# Patient Record
Sex: Male | Born: 1955 | Smoking: Current every day smoker
Health system: Southern US, Community
[De-identification: ages and names within clinical notes are randomized; demographics above are authoritative.]

## PROBLEM LIST (undated history)

## (undated) ENCOUNTER — Telehealth

## (undated) ENCOUNTER — Encounter

## (undated) ENCOUNTER — Ambulatory Visit: Payer: MEDICARE

## (undated) ENCOUNTER — Ambulatory Visit: Payer: MEDICARE | Attending: Surgery | Primary: Surgery

## (undated) ENCOUNTER — Ambulatory Visit

## (undated) ENCOUNTER — Encounter: Attending: Hematology & Oncology | Primary: Hematology & Oncology

## (undated) ENCOUNTER — Ambulatory Visit: Payer: Medicare (Managed Care)

## (undated) ENCOUNTER — Telehealth: Attending: Physician Assistant | Primary: Physician Assistant

## (undated) ENCOUNTER — Inpatient Hospital Stay

## (undated) ENCOUNTER — Encounter: Attending: Radiation Oncology | Primary: Radiation Oncology

## (undated) ENCOUNTER — Encounter: Attending: Cardiovascular Disease | Primary: Cardiovascular Disease

## (undated) ENCOUNTER — Other Ambulatory Visit

## (undated) ENCOUNTER — Encounter: Attending: Family | Primary: Family

## (undated) ENCOUNTER — Ambulatory Visit: Payer: MEDICARE | Attending: Cardiovascular Disease | Primary: Cardiovascular Disease

## (undated) ENCOUNTER — Encounter: Attending: Surgery | Primary: Surgery

## (undated) ENCOUNTER — Ambulatory Visit: Payer: MEDICAID | Attending: Surgery | Primary: Surgery

## (undated) ENCOUNTER — Ambulatory Visit: Attending: Radiation Oncology | Primary: Radiation Oncology

## (undated) ENCOUNTER — Telehealth: Attending: Surgery | Primary: Surgery

## (undated) ENCOUNTER — Telehealth: Attending: Hematology & Oncology | Primary: Hematology & Oncology

## (undated) ENCOUNTER — Ambulatory Visit: Payer: Medicare (Managed Care) | Attending: Hematology & Oncology | Primary: Hematology & Oncology

## (undated) ENCOUNTER — Ambulatory Visit: Attending: Medical | Primary: Medical

## (undated) ENCOUNTER — Ambulatory Visit: Payer: MEDICARE | Attending: Colon & Rectal Surgery | Primary: Colon & Rectal Surgery

## (undated) ENCOUNTER — Telehealth: Attending: Clinical | Primary: Clinical

## (undated) ENCOUNTER — Institutional Professional Consult (permissible substitution): Attending: Radiation Oncology | Primary: Radiation Oncology

## (undated) ENCOUNTER — Ambulatory Visit: Payer: Medicare (Managed Care) | Attending: Surgery | Primary: Surgery

## (undated) ENCOUNTER — Encounter
Attending: Student in an Organized Health Care Education/Training Program | Primary: Student in an Organized Health Care Education/Training Program

## (undated) ENCOUNTER — Ambulatory Visit: Attending: Colon & Rectal Surgery | Primary: Colon & Rectal Surgery

## (undated) ENCOUNTER — Encounter: Attending: Adult Health | Primary: Adult Health

## (undated) ENCOUNTER — Ambulatory Visit: Payer: MEDICARE | Attending: Neurology | Primary: Neurology

## (undated) ENCOUNTER — Ambulatory Visit: Payer: Medicare (Managed Care) | Attending: Neurology | Primary: Neurology

## (undated) ENCOUNTER — Telehealth: Attending: Family | Primary: Family

## (undated) DIAGNOSIS — J449 Chronic obstructive pulmonary disease, unspecified: Secondary | ICD-10-CM

## (undated) DIAGNOSIS — I639 Cerebral infarction, unspecified: Secondary | ICD-10-CM

## (undated) DIAGNOSIS — I10 Essential (primary) hypertension: Secondary | ICD-10-CM

## (undated) DIAGNOSIS — I4891 Unspecified atrial fibrillation: Secondary | ICD-10-CM

## (undated) DIAGNOSIS — E7800 Pure hypercholesterolemia, unspecified: Secondary | ICD-10-CM

## (undated) HISTORY — PX: APPENDECTOMY: SHX54

## (undated) MED ORDER — OXYCODONE-ACETAMINOPHEN 5 MG-325 MG TABLET
ORAL | 0 days | PRN
Start: ? — End: 2021-02-11

## (undated) MED ORDER — FUROSEMIDE 20 MG TABLET: Freq: Every day | ORAL | 0 days | Status: SS | PRN

---
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
## 2017-04-03 ENCOUNTER — Ambulatory Visit
Admission: RE | Admit: 2017-04-03 | Discharge: 2017-04-03 | Disposition: A | Discharge status: Home / Self Care | Attending: Colon & Rectal Surgery | Admitting: Colon & Rectal Surgery

## 2017-04-03 DIAGNOSIS — K603 Anal fistula: Principal | ICD-10-CM

## 2017-06-17 DIAGNOSIS — K603 Anal fistula: Principal | ICD-10-CM

## 2017-06-18 ENCOUNTER — Ambulatory Visit
Admission: RE | Admit: 2017-06-18 | Discharge: 2017-06-18 | Disposition: A | Discharge status: Home / Self Care | Attending: Certified Registered" | Admitting: Certified Registered"

## 2017-06-18 ENCOUNTER — Ambulatory Visit
Admission: RE | Admit: 2017-06-18 | Discharge: 2017-06-18 | Disposition: A | Discharge status: Home / Self Care | Attending: Colon & Rectal Surgery | Admitting: Colon & Rectal Surgery

## 2017-06-18 DIAGNOSIS — K603 Anal fistula: Principal | ICD-10-CM

## 2017-06-18 MED ORDER — ACETAMINOPHEN 325 MG TABLET
ORAL_TABLET | Freq: Four times a day (QID) | ORAL | 0 refills | 0.00000 days | Status: CP
Start: 2017-06-18 — End: ?

## 2017-06-18 MED ORDER — OXYCODONE 5 MG TABLET
ORAL_TABLET | ORAL | 0 refills | 0.00000 days | Status: CP | PRN
Start: 2017-06-18 — End: 2017-06-18

## 2017-06-18 MED ORDER — ONDANSETRON 4 MG DISINTEGRATING TABLET
ORAL_TABLET | Freq: Three times a day (TID) | ORAL | 0 refills | 0.00000 days | Status: CP | PRN
Start: 2017-06-18 — End: 2017-07-18

## 2017-06-18 MED ORDER — OXYCODONE 5 MG TABLET: 5 mg | tablet | 0 refills | 0 days | Status: AC

## 2017-06-18 MED ORDER — DOCUSATE SODIUM 100 MG CAPSULE
ORAL_CAPSULE | Freq: Two times a day (BID) | ORAL | 0 refills | 0.00000 days | Status: CP
Start: 2017-06-18 — End: 2017-07-18

## 2017-07-04 ENCOUNTER — Ambulatory Visit
Admission: RE | Admit: 2017-07-04 | Discharge: 2017-07-04 | Disposition: A | Discharge status: Home / Self Care | Payer: MEDICAID | Attending: Internal Medicine

## 2017-07-04 DIAGNOSIS — I4891 Unspecified atrial fibrillation: Principal | ICD-10-CM

## 2017-07-04 DIAGNOSIS — I639 Cerebral infarction, unspecified: Secondary | ICD-10-CM

## 2017-07-04 DIAGNOSIS — I4819 Other persistent atrial fibrillation: Secondary | ICD-10-CM

## 2017-07-04 DIAGNOSIS — N182 Chronic kidney disease, stage 2 (mild): Secondary | ICD-10-CM

## 2017-07-04 MED ORDER — APIXABAN 5 MG TABLET
ORAL_TABLET | Freq: Two times a day (BID) | ORAL | 6 refills | 0.00000 days | Status: CP
Start: 2017-07-04 — End: 2017-08-03

## 2017-07-17 ENCOUNTER — Ambulatory Visit
Admission: RE | Admit: 2017-07-17 | Discharge: 2017-07-17 | Disposition: A | Discharge status: Home / Self Care | Payer: MEDICAID | Attending: Colon & Rectal Surgery | Admitting: Colon & Rectal Surgery

## 2017-07-17 DIAGNOSIS — K603 Anal fistula: Principal | ICD-10-CM

## 2017-08-15 ENCOUNTER — Ambulatory Visit
Admission: RE | Admit: 2017-08-15 | Discharge: 2017-08-15 | Disposition: A | Discharge status: Home / Self Care | Payer: MEDICAID | Attending: Colon & Rectal Surgery

## 2017-08-15 DIAGNOSIS — Z09 Encounter for follow-up examination after completed treatment for conditions other than malignant neoplasm: Secondary | ICD-10-CM

## 2017-08-15 DIAGNOSIS — K603 Anal fistula: Principal | ICD-10-CM

## 2017-08-25 ENCOUNTER — Ambulatory Visit: Admission: RE | Admit: 2017-08-25 | Discharge: 2017-08-25 | Disposition: A | Discharge status: Home / Self Care

## 2017-08-25 DIAGNOSIS — I4819 Other persistent atrial fibrillation: Principal | ICD-10-CM

## 2017-09-05 ENCOUNTER — Ambulatory Visit
Admission: RE | Admit: 2017-09-05 | Discharge: 2017-09-05 | Disposition: A | Discharge status: Home / Self Care | Payer: MEDICAID | Attending: Internal Medicine | Admitting: Internal Medicine

## 2017-09-05 DIAGNOSIS — I482 Chronic atrial fibrillation, unspecified: Principal | ICD-10-CM

## 2017-11-03 ENCOUNTER — Ambulatory Visit
Admit: 2017-11-03 | Discharge: 2017-11-04 | Payer: MEDICARE | Attending: Colon & Rectal Surgery | Primary: Colon & Rectal Surgery

## 2017-11-03 DIAGNOSIS — K603 Anal fistula: Principal | ICD-10-CM

## 2017-11-03 DIAGNOSIS — I482 Chronic atrial fibrillation, unspecified: Secondary | ICD-10-CM

## 2017-11-03 DIAGNOSIS — F17200 Nicotine dependence, unspecified, uncomplicated: Secondary | ICD-10-CM

## 2017-11-03 DIAGNOSIS — N182 Chronic kidney disease, stage 2 (mild): Secondary | ICD-10-CM

## 2017-11-27 DIAGNOSIS — K6131 Horseshoe abscess: Principal | ICD-10-CM

## 2017-11-28 ENCOUNTER — Encounter: Admit: 2017-11-28 | Discharge: 2017-11-28 | Payer: MEDICARE

## 2017-11-28 ENCOUNTER — Ambulatory Visit: Admit: 2017-11-28 | Discharge: 2017-11-28 | Payer: MEDICARE

## 2017-11-28 DIAGNOSIS — K6131 Horseshoe abscess: Principal | ICD-10-CM

## 2017-11-28 MED ORDER — OXYCODONE 5 MG TABLET
ORAL_TABLET | ORAL | 0 refills | 0.00000 days | Status: CP | PRN
Start: 2017-11-28 — End: 2017-12-03

## 2018-01-16 MED ORDER — TRAZODONE 100 MG TABLET
ORAL_TABLET | 3 refills | 0 days
Start: 2018-01-16 — End: 2019-04-07

## 2018-02-15 MED ORDER — LISINOPRIL 20 MG-HYDROCHLOROTHIAZIDE 25 MG TABLET
ORAL_TABLET | 3 refills | 0 days
Start: 2018-02-15 — End: 2019-04-23

## 2018-03-05 ENCOUNTER — Emergency Department (HOSPITAL_COMMUNITY): Payer: Medicaid Other

## 2018-03-05 ENCOUNTER — Encounter (HOSPITAL_COMMUNITY): Payer: Self-pay

## 2018-03-05 ENCOUNTER — Observation Stay (HOSPITAL_COMMUNITY)
Admission: EM | Admit: 2018-03-05 | Discharge: 2018-03-06 | Disposition: A | Discharge status: Home / Self Care | Payer: Medicaid Other | Attending: Internal Medicine | Admitting: Internal Medicine

## 2018-03-05 DIAGNOSIS — J449 Chronic obstructive pulmonary disease, unspecified: Secondary | ICD-10-CM | POA: Diagnosis present

## 2018-03-05 DIAGNOSIS — Z7982 Long term (current) use of aspirin: Secondary | ICD-10-CM | POA: Insufficient documentation

## 2018-03-05 DIAGNOSIS — G9341 Metabolic encephalopathy: Secondary | ICD-10-CM | POA: Diagnosis not present

## 2018-03-05 DIAGNOSIS — G459 Transient cerebral ischemic attack, unspecified: Secondary | ICD-10-CM

## 2018-03-05 DIAGNOSIS — Z7901 Long term (current) use of anticoagulants: Secondary | ICD-10-CM | POA: Diagnosis not present

## 2018-03-05 DIAGNOSIS — I4891 Unspecified atrial fibrillation: Secondary | ICD-10-CM | POA: Diagnosis present

## 2018-03-05 DIAGNOSIS — R200 Anesthesia of skin: Principal | ICD-10-CM | POA: Insufficient documentation

## 2018-03-05 DIAGNOSIS — E7800 Pure hypercholesterolemia, unspecified: Secondary | ICD-10-CM | POA: Diagnosis not present

## 2018-03-05 DIAGNOSIS — R55 Syncope and collapse: Secondary | ICD-10-CM | POA: Insufficient documentation

## 2018-03-05 DIAGNOSIS — N179 Acute kidney failure, unspecified: Secondary | ICD-10-CM | POA: Diagnosis not present

## 2018-03-05 DIAGNOSIS — E876 Hypokalemia: Secondary | ICD-10-CM | POA: Diagnosis not present

## 2018-03-05 DIAGNOSIS — I10 Essential (primary) hypertension: Secondary | ICD-10-CM | POA: Diagnosis not present

## 2018-03-05 DIAGNOSIS — R251 Tremor, unspecified: Secondary | ICD-10-CM | POA: Diagnosis present

## 2018-03-05 DIAGNOSIS — D72829 Elevated white blood cell count, unspecified: Secondary | ICD-10-CM | POA: Diagnosis not present

## 2018-03-05 DIAGNOSIS — I482 Chronic atrial fibrillation, unspecified: Secondary | ICD-10-CM

## 2018-03-05 DIAGNOSIS — I639 Cerebral infarction, unspecified: Secondary | ICD-10-CM

## 2018-03-05 DIAGNOSIS — F17200 Nicotine dependence, unspecified, uncomplicated: Secondary | ICD-10-CM | POA: Insufficient documentation

## 2018-03-05 HISTORY — DX: Cerebral infarction, unspecified: I63.9

## 2018-03-05 HISTORY — DX: Pure hypercholesterolemia, unspecified: E78.00

## 2018-03-05 HISTORY — DX: Chronic obstructive pulmonary disease, unspecified: J44.9

## 2018-03-05 HISTORY — DX: Unspecified atrial fibrillation: I48.91

## 2018-03-05 HISTORY — DX: Essential (primary) hypertension: I10

## 2018-03-05 LAB — I-STAT TROPONIN, ED: Troponin i, poc: 0 ng/mL (ref 0.00–0.08)

## 2018-03-05 LAB — COMPREHENSIVE METABOLIC PANEL
ALBUMIN: 3.6 g/dL (ref 3.5–5.0)
ALK PHOS: 84 U/L (ref 38–126)
ALT: 15 U/L — AB (ref 17–63)
AST: 23 U/L (ref 15–41)
Anion gap: 9 (ref 5–15)
BILIRUBIN TOTAL: 0.6 mg/dL (ref 0.3–1.2)
BUN: 26 mg/dL — ABNORMAL HIGH (ref 6–20)
CALCIUM: 9.4 mg/dL (ref 8.9–10.3)
CO2: 23 mmol/L (ref 22–32)
Chloride: 106 mmol/L (ref 101–111)
Creatinine, Ser: 1.66 mg/dL — ABNORMAL HIGH (ref 0.61–1.24)
GFR calc non Af Amer: 42 mL/min — ABNORMAL LOW (ref >60)
GFR, EST AFRICAN AMERICAN: 49 mL/min — AB (ref >60)
Glucose, Bld: 120 mg/dL — ABNORMAL HIGH (ref 65–99)
Potassium: 3 mmol/L — ABNORMAL LOW (ref 3.5–5.1)
SODIUM: 138 mmol/L (ref 135–145)
Total Protein: 7.2 g/dL (ref 6.5–8.1)

## 2018-03-05 LAB — PROTIME-INR
INR: 1.29
Prothrombin Time: 16 seconds — ABNORMAL HIGH (ref 11.4–15.2)

## 2018-03-05 LAB — URINALYSIS, ROUTINE W REFLEX MICROSCOPIC
Bilirubin Urine: NEGATIVE
GLUCOSE, UA: NEGATIVE mg/dL
HGB URINE DIPSTICK: NEGATIVE
KETONES UR: NEGATIVE mg/dL
Leukocytes, UA: NEGATIVE
Nitrite: NEGATIVE
PH: 5 (ref 5.0–8.0)
Protein, ur: NEGATIVE mg/dL
Specific Gravity, Urine: 1.009 (ref 1.005–1.030)

## 2018-03-05 LAB — CBC WITH DIFFERENTIAL/PLATELET
ABS IMMATURE GRANULOCYTES: 0.1 10*3/uL (ref 0.0–0.1)
BASOS ABS: 0.1 10*3/uL (ref 0.0–0.1)
Basophils Relative: 1 %
Eosinophils Absolute: 0.1 10*3/uL (ref 0.0–0.7)
Eosinophils Relative: 1 %
HEMATOCRIT: 43 % (ref 39.0–52.0)
HEMOGLOBIN: 14.1 g/dL (ref 13.0–17.0)
Immature Granulocytes: 0 %
LYMPHS ABS: 1.4 10*3/uL (ref 0.7–4.0)
LYMPHS PCT: 12 %
MCH: 30.5 pg (ref 26.0–34.0)
MCHC: 32.8 g/dL (ref 30.0–36.0)
MCV: 92.9 fL (ref 78.0–100.0)
MONO ABS: 0.6 10*3/uL (ref 0.1–1.0)
Monocytes Relative: 5 %
NEUTROS ABS: 9.6 10*3/uL — AB (ref 1.7–7.7)
Neutrophils Relative %: 81 %
Platelets: 262 10*3/uL (ref 150–400)
RBC: 4.63 MIL/uL (ref 4.22–5.81)
RDW: 14.2 % (ref 11.5–15.5)
WBC: 11.9 10*3/uL — ABNORMAL HIGH (ref 4.0–10.5)

## 2018-03-05 LAB — CBG MONITORING, ED: GLUCOSE-CAPILLARY: 103 mg/dL — AB (ref 65–99)

## 2018-03-05 LAB — AMMONIA: Ammonia: 34 umol/L (ref 9–35)

## 2018-03-05 MED ORDER — METOPROLOL SUCCINATE ER 100 MG PO TB24
100.0000 mg | ORAL_TABLET | Freq: Every day | ORAL | Status: DC
  Administered 2018-03-06: 100 mg via ORAL
  Filled 2018-03-05: qty 1

## 2018-03-05 MED ORDER — ACETAMINOPHEN 325 MG PO TABS: 650.0000 mg | ORAL_TABLET | ORAL | Status: DC | PRN

## 2018-03-05 MED ORDER — ACETAMINOPHEN 160 MG/5ML PO SOLN: 650.0000 mg | ORAL | Status: DC | PRN

## 2018-03-05 MED ORDER — ZOLPIDEM TARTRATE 5 MG PO TABS: 5.0000 mg | ORAL_TABLET | Freq: Every evening | ORAL | Status: DC | PRN

## 2018-03-05 MED ORDER — HYDRALAZINE HCL 20 MG/ML IJ SOLN: 5.0000 mg | INTRAMUSCULAR | Status: DC | PRN

## 2018-03-05 MED ORDER — ASPIRIN 81 MG PO CHEW
81.0000 mg | CHEWABLE_TABLET | Freq: Every day | ORAL | Status: DC
  Administered 2018-03-06: 81 mg via ORAL
  Filled 2018-03-05: qty 1

## 2018-03-05 MED ORDER — IOPAMIDOL (ISOVUE-370) INJECTION 76%
INTRAVENOUS | Status: AC
  Filled 2018-03-05: qty 50

## 2018-03-05 MED ORDER — IOPAMIDOL (ISOVUE-370) INJECTION 76%
50.0000 mL | Freq: Once | INTRAVENOUS | Status: AC | PRN
  Administered 2018-03-05: 50 mL via INTRAVENOUS

## 2018-03-05 MED ORDER — ATORVASTATIN CALCIUM 10 MG PO TABS
10.0000 mg | ORAL_TABLET | Freq: Every day | ORAL | Status: DC
  Administered 2018-03-06: 10 mg via ORAL
  Filled 2018-03-05: qty 1

## 2018-03-05 MED ORDER — ONDANSETRON HCL 4 MG/2ML IJ SOLN
4.0000 mg | Freq: Three times a day (TID) | INTRAMUSCULAR | Status: DC | PRN
Start: 2018-03-05 — End: 2018-03-06

## 2018-03-05 MED ORDER — STROKE: EARLY STAGES OF RECOVERY BOOK
Freq: Once | Status: AC
  Administered 2018-03-06: 01:00:00
  Filled 2018-03-05: qty 1

## 2018-03-05 MED ORDER — ACETAMINOPHEN 650 MG RE SUPP: 650.0000 mg | RECTAL | Status: DC | PRN

## 2018-03-05 MED ORDER — POTASSIUM CHLORIDE 20 MEQ/15ML (10%) PO SOLN
40.0000 meq | Freq: Once | ORAL | Status: AC
  Administered 2018-03-06: 40 meq via ORAL
  Filled 2018-03-05: qty 30

## 2018-03-05 MED ORDER — SODIUM CHLORIDE 0.9 % IV BOLUS
1000.0000 mL | Freq: Once | INTRAVENOUS | Status: AC
  Administered 2018-03-05: 1000 mL via INTRAVENOUS

## 2018-03-05 MED ORDER — APIXABAN 5 MG PO TABS
5.0000 mg | ORAL_TABLET | Freq: Two times a day (BID) | ORAL | Status: DC
  Administered 2018-03-06 (×2): 5 mg via ORAL
  Filled 2018-03-05 (×2): qty 1

## 2018-03-05 MED ORDER — SENNOSIDES-DOCUSATE SODIUM 8.6-50 MG PO TABS: 1.0000 | ORAL_TABLET | Freq: Every evening | ORAL | Status: DC | PRN

## 2018-03-05 MED ORDER — SODIUM CHLORIDE 0.9 % IV SOLN
INTRAVENOUS | Status: DC
  Administered 2018-03-06: 01:00:00 via INTRAVENOUS

## 2018-03-05 NOTE — ED Provider Notes (Signed)
[facility] EMERGENCY DEPARTMENT Provider Note   CSN: [id number] Arrival date & time: 03/05/18  1957     History   Chief Complaint No chief complaint on file.   HPI Keith Morton is a 62 y.o. male.  Level 5 caveat secondary to altered mental status.  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  After that patient is unsure of what happened.  Per EMS he had some tremor to his right arm and then became altered and combative on scene with some incontinence of urine and had bit his tongue.  Patient does not recall any of this.  He denies any prior history of seizures and does not drink very frequently.  He still is confused and a little evasive on answers.  Currently says is tingling in his arm is resolved and he denies any medical complaints now.  The history is provided by the patient and the EMS personnel.  Altered Mental Status   This is a new problem. The current episode started less than 1 hour ago. The problem has been gradually improving. Associated symptoms include confusion, seizures (possible) and agitation. His past medical history is significant for hypertension.    Past Medical History:  Diagnosis Date  . COPD (chronic obstructive pulmonary disease) (HCC)   . Hypertension     There are no active problems to display for this patient.   Past Surgical History:  Procedure Laterality Date  . APPENDECTOMY          Home Medications    Prior to Admission medications   Not on File    Family History History reviewed. No pertinent family history.  Social History Social History   Tobacco Use  . Smoking status: Current Every Day Smoker  . Smokeless tobacco: Never Used  Substance Use Topics  . Alcohol use: Not on file  . Drug use: Not on file     Allergies   Patient has no known allergies.   Review of Systems Review of Systems  Unable to perform ROS: Mental status  change  Constitutional: Negative for chills and fever.  HENT: Negative for sore throat.   Respiratory: Negative for shortness of breath.   Cardiovascular: Negative for chest pain.  Gastrointestinal: Negative for abdominal pain.  Genitourinary: Negative for dysuria.  Skin: Negative for rash.  Neurological: Positive for seizures (possible) and numbness.  Psychiatric/Behavioral: Positive for agitation and confusion.     Physical Exam Updated Vital Signs BP 127/86 (BP Location: Right Arm)   Pulse (!) 101   Temp 99 F (37.2 C) (Oral)   Resp (!) 26   Ht 5\' 9"  (1.753 m)   Wt 86.2 kg (190 lb)   SpO2 100%   BMI 28.06 kg/m   Physical Exam  Constitutional: He is oriented to person, place, and time. He appears well-developed and well-nourished.  HENT:  Head: Normocephalic and atraumatic.  Eyes: Conjunctivae are normal.  Neck: Neck supple.  Cardiovascular: Normal pulses. An irregularly irregular rhythm present. Tachycardia present.  No murmur heard. Pulmonary/Chest: Effort normal and breath sounds normal. No respiratory distress.  Abdominal: Soft. There is no tenderness.  Musculoskeletal: He exhibits no edema.  Neurological: He is alert and oriented to person, place, and time. He has normal strength. No cranial nerve deficit or sensory deficit. GCS eye subscore is 4. GCS verbal subscore is 5. GCS motor subscore is 6.  His basic neuro  exam was normal other than he is a little confused with his answers.  When I reassessed him some questions a few minutes later he was more able to give his past medical history.  Skin: Skin is warm and dry.  Psychiatric: He has a normal mood and affect.  Nursing note and vitals reviewed.    ED Treatments / Results  Labs (all labs ordered are listed, but only abnormal results are displayed) Labs Reviewed  CBC WITH DIFFERENTIAL/PLATELET - Abnormal; Notable for the following components:      Result Value   WBC 11.9 (*)    Neutro Abs 9.6 (*)    All  other components within normal limits  COMPREHENSIVE METABOLIC PANEL - Abnormal; Notable for the following components:   Potassium 3.0 (*)    Glucose, Bld 120 (*)    BUN 26 (*)    Creatinine, Ser 1.66 (*)    ALT 15 (*)    GFR calc non Af Amer 42 (*)    GFR calc Af Amer 49 (*)    All other components within normal limits  URINALYSIS, ROUTINE W REFLEX MICROSCOPIC - Abnormal; Notable for the following components:   Color, Urine STRAW (*)    All other components within normal limits  PROTIME-INR - Abnormal; Notable for the following components:   Prothrombin Time 16.0 (*)    All other components within normal limits  MAGNESIUM - Abnormal; Notable for the following components:   Magnesium 1.6 (*)    All other components within normal limits  HEMOGLOBIN A1C - Abnormal; Notable for the following components:   Hgb A1c MFr Bld 5.9 (*)    All other components within normal limits  LIPID PANEL - Abnormal; Notable for the following components:   HDL 22 (*)    All other components within normal limits  CBG MONITORING, ED - Abnormal; Notable for the following components:   Glucose-Capillary 103 (*)    All other components within normal limits  CULTURE, BLOOD (ROUTINE X 2)  CULTURE, BLOOD (ROUTINE X 2)  URINE CULTURE  AMMONIA  CREATININE, URINE, RANDOM  SODIUM, URINE, RANDOM  HIV ANTIBODY (ROUTINE TESTING)  GLUCOSE, CAPILLARY  I-STAT TROPONIN, ED    EKG EKG Interpretation  Date/Time:  Thursday March 05 2018 20:10:25 EDT Ventricular Rate:  86 PR Interval:    QRS Duration: 101 QT Interval:  352 QTC Calculation: 421 R Axis:   74 Text Interpretation:  Atrial fibrillation Borderline repolarization abnormality no prior to compare with Confirmed by Meridee Score [phone]) on 03/05/2018 8:19:31 PM   Radiology Ct Angio Head W Or Wo Contrast  Result Date: 03/05/2018 CLINICAL DATA:  Near syncope, right arm trembling. History of hypertension, hypercholesterolemia, atrial fibrillation and  stroke. EXAM: CT ANGIOGRAPHY HEAD AND NECK TECHNIQUE: Multidetector CT imaging of the head and neck was performed using the standard protocol during bolus administration of intravenous contrast. Multiplanar CT image reconstructions and MIPs were obtained to evaluate the vascular anatomy. Carotid stenosis measurements (when applicable) are obtained utilizing NASCET criteria, using the distal internal carotid diameter as the denominator. CONTRAST:  50mL ISOVUE-370 IOPAMIDOL (ISOVUE-370) INJECTION 76% COMPARISON:  None. FINDINGS: CT HEAD FINDINGS BRAIN: No intraparenchymal hemorrhage, mass effect nor midline shift. Old small right cerebellar infarct. Left parietotemporal encephalomalacia. Mild ex vacuo dilatation left lateral ventricle. No hydrocephalus. Patchy supratentorial white matter FLAIR T2 hyperintensities compatible mild chronic small vessel ischemic changes. No acute large vascular territory infarcts. No abnormal extra-axial fluid collections. Basal cisterns are patent. VASCULAR: Mild calcific  atherosclerosis of the carotid siphons. SKULL: No skull fracture. No significant scalp soft tissue swelling. SINUSES/ORBITS: The mastoid air-cells and included paranasal sinuses are well-aerated.The included ocular globes and orbital contents are non-suspicious. OTHER: None. CTA NECK FINDINGS: AORTIC ARCH: Normal appearance of the thoracic arch, normal branch pattern. Mild calcific atherosclerosis. The origins of the innominate, left Common carotid artery and subclavian artery are widely patent. RIGHT CAROTID SYSTEM: Common carotid artery is patent. Moderate calcific atherosclerosis of the carotid bifurcation without hemodynamically significant stenosis by NASCET criteria. Normal appearance of the internal carotid artery. LEFT CAROTID SYSTEM: Common carotid artery is patent. Ectatic left carotid bifurcation with intimal thickening to lesser extent calcific atherosclerosis, no hemodynamically significant stenosis by  NASCET criteria. Normal appearance of the internal carotid artery. VERTEBRAL ARTERIES:Right vertebral artery is dominant. Patent vertebral arteries, mild left luminal irregularity compatible with atherosclerosis. SKELETON: No acute osseous process though bone windows have not been submitted. Degenerative cervical spine resulting in severe left C5-6 and bilateral C6-7 neural foraminal narrowing. OTHER NECK: Soft tissues of the neck are nonacute though, not tailored for evaluation. UPPER CHEST: Moderate centrilobular emphysema. CTA HEAD FINDINGS: ANTERIOR CIRCULATION: Patent cervical internal carotid arteries, petrous, cavernous and supra clinoid internal carotid arteries. Patent anterior communicating artery. Patent anterior and middle cerebral arteries, mild-to-moderate luminal irregularity compatible with atherosclerosis. No large vessel occlusion, significant stenosis, contrast extravasation or aneurysm. POSTERIOR CIRCULATION: Patent vertebral arteries, vertebrobasilar junction and basilar artery, as well as main branch vessels. Left vertebral artery terminates in a posterior inferior cerebellar artery. Patent posterior cerebral arteries. Mild-to-moderate luminal irregularity posterior cerebral arteries compatible with atherosclerosis. Robust right posterior communicating artery present. No large vessel occlusion, significant stenosis, contrast extravasation or aneurysm. VENOUS SINUSES: Major dural venous sinuses are patent though not tailored for evaluation on this angiographic examination. ANATOMIC VARIANTS: None. DELAYED PHASE: No abnormal intracranial enhancement. MIP images reviewed. IMPRESSION: CT head: 1. No acute intracranial process. 2. 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 Old small right cerebellar infarct. CTA neck: 1. No hemodynamically significant stenosis or acute vascular process. 2. Severe C5-6 and C6-7 neural foraminal narrowing. CTA head: 1. No emergent large vessel occlusion or  flow-limiting stenosis. 2. Mild-to-moderate atherosclerosis. Aortic Atherosclerosis (ICD10-I70.0). Emphysema (ICD10-J43.9). Electronically Signed   By: Awilda Metro M.D.   On: 03/05/2018 22:51   Dg Chest 2 View  Result Date: 03/05/2018 CLINICAL DATA:  Acute confusion. EXAM: CHEST - 2 VIEW COMPARISON:  None. FINDINGS: The cardiomediastinal silhouette is unremarkable. Mild peribronchial thickening noted. There is no evidence of focal airspace disease, pulmonary edema, suspicious pulmonary nodule/mass, pleural effusion, or pneumothorax. No acute bony abnormalities are identified. IMPRESSION: No active cardiopulmonary disease. Electronically Signed   By: Harmon Pier M.D.   On: 03/05/2018 21:21   Ct Head Wo Contrast  Result Date: 03/05/2018 CLINICAL DATA:  Near syncope, right arm trembling. History of hypertension, hypercholesterolemia, atrial fibrillation and stroke. EXAM: CT ANGIOGRAPHY HEAD AND NECK TECHNIQUE: Multidetector CT imaging of the head and neck was performed using the standard protocol during bolus administration of intravenous contrast. Multiplanar CT image reconstructions and MIPs were obtained to evaluate the vascular anatomy. Carotid stenosis measurements (when applicable) are obtained utilizing NASCET criteria, using the distal internal carotid diameter as the denominator. CONTRAST:  50mL ISOVUE-370 IOPAMIDOL (ISOVUE-370) INJECTION 76% COMPARISON:  None. FINDINGS: CT HEAD FINDINGS BRAIN: No intraparenchymal hemorrhage, mass effect nor midline shift. Old small right cerebellar infarct. Left parietotemporal encephalomalacia. Mild ex vacuo dilatation left lateral ventricle. No hydrocephalus. Patchy supratentorial white matter FLAIR T2 hyperintensities  compatible mild chronic small vessel ischemic changes. No acute large vascular territory infarcts. No abnormal extra-axial fluid collections. Basal cisterns are patent. VASCULAR: Mild calcific atherosclerosis of the carotid siphons. SKULL: No  skull fracture. No significant scalp soft tissue swelling. SINUSES/ORBITS: The mastoid air-cells and included paranasal sinuses are well-aerated.The included ocular globes and orbital contents are non-suspicious. OTHER: None. CTA NECK FINDINGS: AORTIC ARCH: Normal appearance of the thoracic arch, normal branch pattern. Mild calcific atherosclerosis. The origins of the innominate, left Common carotid artery and subclavian artery are widely patent. RIGHT CAROTID SYSTEM: Common carotid artery is patent. Moderate calcific atherosclerosis of the carotid bifurcation without hemodynamically significant stenosis by NASCET criteria. Normal appearance of the internal carotid artery. LEFT CAROTID SYSTEM: Common carotid artery is patent. Ectatic left carotid bifurcation with intimal thickening to lesser extent calcific atherosclerosis, no hemodynamically significant stenosis by NASCET criteria. Normal appearance of the internal carotid artery. VERTEBRAL ARTERIES:Right vertebral artery is dominant. Patent vertebral arteries, mild left luminal irregularity compatible with atherosclerosis. SKELETON: No acute osseous process though bone windows have not been submitted. Degenerative cervical spine resulting in severe left C5-6 and bilateral C6-7 neural foraminal narrowing. OTHER NECK: Soft tissues of the neck are nonacute though, not tailored for evaluation. UPPER CHEST: Moderate centrilobular emphysema. CTA HEAD FINDINGS: ANTERIOR CIRCULATION: Patent cervical internal carotid arteries, petrous, cavernous and supra clinoid internal carotid arteries. Patent anterior communicating artery. Patent anterior and middle cerebral arteries, mild-to-moderate luminal irregularity compatible with atherosclerosis. No large vessel occlusion, significant stenosis, contrast extravasation or aneurysm. POSTERIOR CIRCULATION: Patent vertebral arteries, vertebrobasilar junction and basilar artery, as well as main branch vessels. Left vertebral artery  terminates in a posterior inferior cerebellar artery. Patent posterior cerebral arteries. Mild-to-moderate luminal irregularity posterior cerebral arteries compatible with atherosclerosis. Robust right posterior communicating artery present. No large vessel occlusion, significant stenosis, contrast extravasation or aneurysm. VENOUS SINUSES: Major dural venous sinuses are patent though not tailored for evaluation on this angiographic examination. ANATOMIC VARIANTS: None. DELAYED PHASE: No abnormal intracranial enhancement. MIP images reviewed. IMPRESSION: CT head: 1. No acute intracranial process. 2. 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 Old small right cerebellar infarct. CTA neck: 1. No hemodynamically significant stenosis or acute vascular process. 2. Severe C5-6 and C6-7 neural foraminal narrowing. CTA head: 1. No emergent large vessel occlusion or flow-limiting stenosis. 2. Mild-to-moderate atherosclerosis. Aortic Atherosclerosis (ICD10-I70.0). Emphysema (ICD10-J43.9). Electronically Signed   By: Awilda Metro M.D.   On: 03/05/2018 22:51   Ct Angio Neck W And/or Wo Contrast  Result Date: 03/05/2018 CLINICAL DATA:  Near syncope, right arm trembling. History of hypertension, hypercholesterolemia, atrial fibrillation and stroke. EXAM: CT ANGIOGRAPHY HEAD AND NECK TECHNIQUE: Multidetector CT imaging of the head and neck was performed using the standard protocol during bolus administration of intravenous contrast. Multiplanar CT image reconstructions and MIPs were obtained to evaluate the vascular anatomy. Carotid stenosis measurements (when applicable) are obtained utilizing NASCET criteria, using the distal internal carotid diameter as the denominator. CONTRAST:  50mL ISOVUE-370 IOPAMIDOL (ISOVUE-370) INJECTION 76% COMPARISON:  None. FINDINGS: CT HEAD FINDINGS BRAIN: No intraparenchymal hemorrhage, mass effect nor midline shift. Old small right cerebellar infarct. Left parietotemporal  encephalomalacia. Mild ex vacuo dilatation left lateral ventricle. No hydrocephalus. Patchy supratentorial white matter FLAIR T2 hyperintensities compatible mild chronic small vessel ischemic changes. No acute large vascular territory infarcts. No abnormal extra-axial fluid collections. Basal cisterns are patent. VASCULAR: Mild calcific atherosclerosis of the carotid siphons. SKULL: No skull fracture. No significant scalp soft tissue swelling. SINUSES/ORBITS: The mastoid air-cells and included  paranasal sinuses are well-aerated.The included ocular globes and orbital contents are non-suspicious. OTHER: None. CTA NECK FINDINGS: AORTIC ARCH: Normal appearance of the thoracic arch, normal branch pattern. Mild calcific atherosclerosis. The origins of the innominate, left Common carotid artery and subclavian artery are widely patent. RIGHT CAROTID SYSTEM: Common carotid artery is patent. Moderate calcific atherosclerosis of the carotid bifurcation without hemodynamically significant stenosis by NASCET criteria. Normal appearance of the internal carotid artery. LEFT CAROTID SYSTEM: Common carotid artery is patent. Ectatic left carotid bifurcation with intimal thickening to lesser extent calcific atherosclerosis, no hemodynamically significant stenosis by NASCET criteria. Normal appearance of the internal carotid artery. VERTEBRAL ARTERIES:Right vertebral artery is dominant. Patent vertebral arteries, mild left luminal irregularity compatible with atherosclerosis. SKELETON: No acute osseous process though bone windows have not been submitted. Degenerative cervical spine resulting in severe left C5-6 and bilateral C6-7 neural foraminal narrowing. OTHER NECK: Soft tissues of the neck are nonacute though, not tailored for evaluation. UPPER CHEST: Moderate centrilobular emphysema. CTA HEAD FINDINGS: ANTERIOR CIRCULATION: Patent cervical internal carotid arteries, petrous, cavernous and supra clinoid internal carotid arteries.  Patent anterior communicating artery. Patent anterior and middle cerebral arteries, mild-to-moderate luminal irregularity compatible with atherosclerosis. No large vessel occlusion, significant stenosis, contrast extravasation or aneurysm. POSTERIOR CIRCULATION: Patent vertebral arteries, vertebrobasilar junction and basilar artery, as well as main branch vessels. Left vertebral artery terminates in a posterior inferior cerebellar artery. Patent posterior cerebral arteries. Mild-to-moderate luminal irregularity posterior cerebral arteries compatible with atherosclerosis. Robust right posterior communicating artery present. No large vessel occlusion, significant stenosis, contrast extravasation or aneurysm. VENOUS SINUSES: Major dural venous sinuses are patent though not tailored for evaluation on this angiographic examination. ANATOMIC VARIANTS: None. DELAYED PHASE: No abnormal intracranial enhancement. MIP images reviewed. IMPRESSION: CT head: 1. No acute intracranial process. 2. 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 Old small right cerebellar infarct. CTA neck: 1. No hemodynamically significant stenosis or acute vascular process. 2. Severe C5-6 and C6-7 neural foraminal narrowing. CTA head: 1. No emergent large vessel occlusion or flow-limiting stenosis. 2. Mild-to-moderate atherosclerosis. Aortic Atherosclerosis (ICD10-I70.0). Emphysema (ICD10-J43.9). Electronically Signed   By: Awilda Metroourtnay  Bloomer M.D.   On: 03/05/2018 22:51   Mr Brain Wo Contrast  Result Date: 03/06/2018 CLINICAL DATA:  Right arm shaking with weakness and numbness EXAM: MRI HEAD WITHOUT CONTRAST TECHNIQUE: Multiplanar, multiecho pulse sequences of the brain and surrounding structures were obtained without intravenous contrast. COMPARISON:  Head CT and CTA from yesterday FINDINGS: Brain: No acute infarct, hemorrhage, hydrocephalus, or masslike finding. Remote left parietal infarct with dense encephalomalacia/gliosis and hemosiderin  staining. Small remote right cerebellar infarct. T2 hyperintensity in the bilateral globus pallidus without volume loss, likely early mineralization. Vascular: Major flow voids are preserved. Skull and upper cervical spine: No evidence of marrow lesion. Sinuses/Orbits: Negative IMPRESSION: 1. No acute finding. 2. Remote left parietal infarct which could serve as a seizure focus. 3. Small remote infarct in the right cerebellum. Electronically Signed   By: Marnee SpringJonathon  Watts M.D.   On: 03/06/2018 10:47    Procedures Procedures (including critical care time)  Medications Ordered in ED Medications - No data to display   Initial Impression / Assessment and Plan / ED Course  I have reviewed the triage vital signs and the nursing notes.  Pertinent labs & imaging results that were available during my care of the patient were reviewed by me and considered in my medical decision making (see chart for details).  Clinical Course as of Mar 08 1199  Thu Mar 05, 2018  2106 I reviewed the history and findings with neurology Dr. Laurence Slate.   He is recommending that we proceed with CT CTA and likely will need an MRI and admission for TIA work-up.  This is due to the fact that the patient is in A. fib and had that prior history that was considered a stroke.   [MB]  2141 And more alert and his wife is also here today we will fill in the some details.  He has a history of A. fib and is on Eliquis.   [MB]  2239 Discussed with Dr. Clyde Lundborg from hospitalist service who will admit the patient his service.   [MB]    Clinical Course User Index [MB] Terrilee Files, MD     Final Clinical Impressions(s) / ED Diagnoses   Final diagnoses:  Chronic atrial fibrillation (HCC)  Chronic obstructive pulmonary disease, unspecified COPD type (HCC)  Hypercholesteremia  Essential hypertension  Cerebrovascular accident (CVA), unspecified mechanism (HCC)  TIA (transient ischemic attack)    ED Discharge Orders    None         Terrilee Files, MD 03/07/18 1201

## 2018-03-05 NOTE — ED Triage Notes (Signed)
Pt presents with report of tremor to R arm followed by altered mental status per EMS.  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

## 2018-03-05 NOTE — Consult Note (Signed)
Requesting Physician: Dr. Charm Barges    Chief Complaint: Right arm shaking, weakness and numbness  History obtained from: Patient and Chart     HPI:                                                                                                                                       Keith Morton is an 62 y.o. male past medical history of stroke, atrial fibrillation on Eliquis, COPD, hypertension, hyperlipidemia who presents to the emergency room after developing sudden onset weakness and numbness of his right arm.  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  Shortly after that the patient was noted to appear limp in his right arm and was shaking it.  The description appears to be more suggestive for tremors rather than tonic-clonic jerking. He then appeared confused.  His coworker called EMS and on arrival patient could not recall the event clearly.  The patient states he remembers his right arm becoming weak and EMS arriving in talking to him-but does not remember the details very clearly.  There is some question of a tongue bite and his pants were wet-unsure if they are wet due to water being spilled on him when this event happened versus urinating on himself. His coworker denies the patient stared off in space, had rhythmic jerking movements or any automatisms such as lip smacking, chewing movements.  No forced gaze deviation was also noted.  Patient has a history of stroke about 4 years ago treated at [facility].  He was diagnosed to have A. fib and is on anticoagulation with Eliquis.  He has no prior history of seizures.  Date last known well: 6.6.9 Time last known well: 6:50 PM tPA Given: no, symptoms resolved NIHSS: 0 Baseline MRS 0    Past Medical History:  Diagnosis Date  . Atrial fibrillation (HCC)   . COPD (chronic obstructive pulmonary disease) (HCC)   . Hypercholesteremia    . Hypertension   . Stroke [facility])     Past Surgical History:  Procedure Laterality Date  . APPENDECTOMY      History reviewed. No pertinent family history. Social History:  reports that he has been smoking.  He has never used smokeless tobacco. His alcohol and drug histories are not on file.  Allergies: No Known Allergies  Medications:  I reviewed home medications   ROS:                                                                                                                                     14 systems reviewed and negative except above    Examination:                                                                                                      General: Appears well-developed and well-nourished.  Psych: Affect appropriate to situation Eyes: No scleral injection HENT: No OP obstrucion Head: Normocephalic.  Cardiovascular: Normal rate and regular rhythm.  Respiratory: Effort normal and breath sounds normal to anterior ascultation GI: Soft.  No distension. There is no tenderness.  Skin: WDI   Neurological Examination Mental Status: Alert, oriented, thought content appropriate.  Speech fluent without evidence of aphasia. Able to follow 3 step commands without difficulty. Cranial Nerves: II: Visual fields grossly normal,  III,IV, VI: ptosis not present, extra-ocular motions intact bilaterally, pupils equal, round, reactive to light and accommodation V,VII: smile symmetric, facial light touch sensation normal bilaterally VIII: hearing normal bilaterally IX,X: uvula rises symmetrically XI: bilateral shoulder shrug XII: midline tongue extension Motor: Right : Upper extremity   5/5    Left:     Upper extremity   5/5  Lower extremity   5/5     Lower extremity   5/5 Tone and bulk:normal tone throughout; no atrophy noted Sensory: Pinprick and  light touch intact throughout, bilaterally Deep Tendon Reflexes: 1+ and symmetric throughout Plantars: Right: downgoing   Left: downgoing Cerebellar: normal finger-to-nose, normal rapid alternating movements and normal heel-to-shin test Gait: normal gait and station     Lab Results: Basic Metabolic Panel: Recent Labs  Lab 03/05/18 2037  NA 138  K 3.0*  CL 106  CO2 23  GLUCOSE 120*  BUN 26*  CREATININE 1.66*  CALCIUM 9.4    CBC: Recent Labs  Lab 03/05/18 2037  WBC 11.9*  NEUTROABS 9.6*  HGB 14.1  HCT 43.0  MCV 92.9  PLT 262    Coagulation Studies: Recent Labs    03/05/18 2037  LABPROT 16.0*  INR 1.29    Imaging: Ct Angio Head W Or Wo Contrast  Result Date: 03/05/2018 CLINICAL DATA:  Near syncope, right arm trembling. History of hypertension, hypercholesterolemia, atrial fibrillation and stroke. EXAM: CT ANGIOGRAPHY HEAD AND NECK TECHNIQUE: Multidetector CT imaging of the head and neck was performed using the standard protocol during bolus  administration of intravenous contrast. Multiplanar CT image reconstructions and MIPs were obtained to evaluate the vascular anatomy. Carotid stenosis measurements (when applicable) are obtained utilizing NASCET criteria, using the distal internal carotid diameter as the denominator. CONTRAST:  50mL ISOVUE-370 IOPAMIDOL (ISOVUE-370) INJECTION 76% COMPARISON:  None. FINDINGS: CT HEAD FINDINGS BRAIN: No intraparenchymal hemorrhage, mass effect nor midline shift. Old small right cerebellar infarct. Left parietotemporal encephalomalacia. Mild ex vacuo dilatation left lateral ventricle. No hydrocephalus. Patchy supratentorial white matter FLAIR T2 hyperintensities compatible mild chronic small vessel ischemic changes. No acute large vascular territory infarcts. No abnormal extra-axial fluid collections. Basal cisterns are patent. VASCULAR: Mild calcific atherosclerosis of the carotid siphons. SKULL: No skull fracture. No significant scalp  soft tissue swelling. SINUSES/ORBITS: The mastoid air-cells and included paranasal sinuses are well-aerated.The included ocular globes and orbital contents are non-suspicious. OTHER: None. CTA NECK FINDINGS: AORTIC ARCH: Normal appearance of the thoracic arch, normal branch pattern. Mild calcific atherosclerosis. The origins of the innominate, left Common carotid artery and subclavian artery are widely patent. RIGHT CAROTID SYSTEM: Common carotid artery is patent. Moderate calcific atherosclerosis of the carotid bifurcation without hemodynamically significant stenosis by NASCET criteria. Normal appearance of the internal carotid artery. LEFT CAROTID SYSTEM: Common carotid artery is patent. Ectatic left carotid bifurcation with intimal thickening to lesser extent calcific atherosclerosis, no hemodynamically significant stenosis by NASCET criteria. Normal appearance of the internal carotid artery. VERTEBRAL ARTERIES:Right vertebral artery is dominant. Patent vertebral arteries, mild left luminal irregularity compatible with atherosclerosis. SKELETON: No acute osseous process though bone windows have not been submitted. Degenerative cervical spine resulting in severe left C5-6 and bilateral C6-7 neural foraminal narrowing. OTHER NECK: Soft tissues of the neck are nonacute though, not tailored for evaluation. UPPER CHEST: Moderate centrilobular emphysema. CTA HEAD FINDINGS: ANTERIOR CIRCULATION: Patent cervical internal carotid arteries, petrous, cavernous and supra clinoid internal carotid arteries. Patent anterior communicating artery. Patent anterior and middle cerebral arteries, mild-to-moderate luminal irregularity compatible with atherosclerosis. No large vessel occlusion, significant stenosis, contrast extravasation or aneurysm. POSTERIOR CIRCULATION: Patent vertebral arteries, vertebrobasilar junction and basilar artery, as well as main branch vessels. Left vertebral artery terminates in a posterior inferior  cerebellar artery. Patent posterior cerebral arteries. Mild-to-moderate luminal irregularity posterior cerebral arteries compatible with atherosclerosis. Robust right posterior communicating artery present. No large vessel occlusion, significant stenosis, contrast extravasation or aneurysm. VENOUS SINUSES: Major dural venous sinuses are patent though not tailored for evaluation on this angiographic examination. ANATOMIC VARIANTS: None. DELAYED PHASE: No abnormal intracranial enhancement. MIP images reviewed. IMPRESSION: CT head: 1. No acute intracranial process. 2. 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 Old small right cerebellar infarct. CTA neck: 1. No hemodynamically significant stenosis or acute vascular process. 2. Severe C5-6 and C6-7 neural foraminal narrowing. CTA head: 1. No emergent large vessel occlusion or flow-limiting stenosis. 2. Mild-to-moderate atherosclerosis. Aortic Atherosclerosis (ICD10-I70.0). Emphysema (ICD10-J43.9). Electronically Signed   By: Awilda Metro M.D.   On: 03/05/2018 22:51   Dg Chest 2 View  Result Date: 03/05/2018 CLINICAL DATA:  Acute confusion. EXAM: CHEST - 2 VIEW COMPARISON:  None. FINDINGS: The cardiomediastinal silhouette is unremarkable. Mild peribronchial thickening noted. There is no evidence of focal airspace disease, pulmonary edema, suspicious pulmonary nodule/mass, pleural effusion, or pneumothorax. No acute bony abnormalities are identified. IMPRESSION: No active cardiopulmonary disease. Electronically Signed   By: Harmon Pier M.D.   On: 03/05/2018 21:21   Ct Head Wo Contrast  Result Date: 03/05/2018 CLINICAL DATA:  Near syncope, right arm trembling. History of hypertension, hypercholesterolemia, atrial fibrillation  and stroke. EXAM: CT ANGIOGRAPHY HEAD AND NECK TECHNIQUE: Multidetector CT imaging of the head and neck was performed using the standard protocol during bolus administration of intravenous contrast. Multiplanar CT image reconstructions  and MIPs were obtained to evaluate the vascular anatomy. Carotid stenosis measurements (when applicable) are obtained utilizing NASCET criteria, using the distal internal carotid diameter as the denominator. CONTRAST:  50mL ISOVUE-370 IOPAMIDOL (ISOVUE-370) INJECTION 76% COMPARISON:  None. FINDINGS: CT HEAD FINDINGS BRAIN: No intraparenchymal hemorrhage, mass effect nor midline shift. Old small right cerebellar infarct. Left parietotemporal encephalomalacia. Mild ex vacuo dilatation left lateral ventricle. No hydrocephalus. Patchy supratentorial white matter FLAIR T2 hyperintensities compatible mild chronic small vessel ischemic changes. No acute large vascular territory infarcts. No abnormal extra-axial fluid collections. Basal cisterns are patent. VASCULAR: Mild calcific atherosclerosis of the carotid siphons. SKULL: No skull fracture. No significant scalp soft tissue swelling. SINUSES/ORBITS: The mastoid air-cells and included paranasal sinuses are well-aerated.The included ocular globes and orbital contents are non-suspicious. OTHER: None. CTA NECK FINDINGS: AORTIC ARCH: Normal appearance of the thoracic arch, normal branch pattern. Mild calcific atherosclerosis. The origins of the innominate, left Common carotid artery and subclavian artery are widely patent. RIGHT CAROTID SYSTEM: Common carotid artery is patent. Moderate calcific atherosclerosis of the carotid bifurcation without hemodynamically significant stenosis by NASCET criteria. Normal appearance of the internal carotid artery. LEFT CAROTID SYSTEM: Common carotid artery is patent. Ectatic left carotid bifurcation with intimal thickening to lesser extent calcific atherosclerosis, no hemodynamically significant stenosis by NASCET criteria. Normal appearance of the internal carotid artery. VERTEBRAL ARTERIES:Right vertebral artery is dominant. Patent vertebral arteries, mild left luminal irregularity compatible with atherosclerosis. SKELETON: No acute  osseous process though bone windows have not been submitted. Degenerative cervical spine resulting in severe left C5-6 and bilateral C6-7 neural foraminal narrowing. OTHER NECK: Soft tissues of the neck are nonacute though, not tailored for evaluation. UPPER CHEST: Moderate centrilobular emphysema. CTA HEAD FINDINGS: ANTERIOR CIRCULATION: Patent cervical internal carotid arteries, petrous, cavernous and supra clinoid internal carotid arteries. Patent anterior communicating artery. Patent anterior and middle cerebral arteries, mild-to-moderate luminal irregularity compatible with atherosclerosis. No large vessel occlusion, significant stenosis, contrast extravasation or aneurysm. POSTERIOR CIRCULATION: Patent vertebral arteries, vertebrobasilar junction and basilar artery, as well as main branch vessels. Left vertebral artery terminates in a posterior inferior cerebellar artery. Patent posterior cerebral arteries. Mild-to-moderate luminal irregularity posterior cerebral arteries compatible with atherosclerosis. Robust right posterior communicating artery present. No large vessel occlusion, significant stenosis, contrast extravasation or aneurysm. VENOUS SINUSES: Major dural venous sinuses are patent though not tailored for evaluation on this angiographic examination. ANATOMIC VARIANTS: None. DELAYED PHASE: No abnormal intracranial enhancement. MIP images reviewed. IMPRESSION: CT head: 1. No acute intracranial process. 2. 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 Old small right cerebellar infarct. CTA neck: 1. No hemodynamically significant stenosis or acute vascular process. 2. Severe C5-6 and C6-7 neural foraminal narrowing. CTA head: 1. No emergent large vessel occlusion or flow-limiting stenosis. 2. Mild-to-moderate atherosclerosis. Aortic Atherosclerosis (ICD10-I70.0). Emphysema (ICD10-J43.9). Electronically Signed   By: Awilda Metro M.D.   On: 03/05/2018 22:51   Ct Angio Neck W And/or Wo  Contrast  Result Date: 03/05/2018 CLINICAL DATA:  Near syncope, right arm trembling. History of hypertension, hypercholesterolemia, atrial fibrillation and stroke. EXAM: CT ANGIOGRAPHY HEAD AND NECK TECHNIQUE: Multidetector CT imaging of the head and neck was performed using the standard protocol during bolus administration of intravenous contrast. Multiplanar CT image reconstructions and MIPs were obtained to evaluate the vascular anatomy. Carotid stenosis measurements (when  applicable) are obtained utilizing NASCET criteria, using the distal internal carotid diameter as the denominator. CONTRAST:  50mL ISOVUE-370 IOPAMIDOL (ISOVUE-370) INJECTION 76% COMPARISON:  None. FINDINGS: CT HEAD FINDINGS BRAIN: No intraparenchymal hemorrhage, mass effect nor midline shift. Old small right cerebellar infarct. Left parietotemporal encephalomalacia. Mild ex vacuo dilatation left lateral ventricle. No hydrocephalus. Patchy supratentorial white matter FLAIR T2 hyperintensities compatible mild chronic small vessel ischemic changes. No acute large vascular territory infarcts. No abnormal extra-axial fluid collections. Basal cisterns are patent. VASCULAR: Mild calcific atherosclerosis of the carotid siphons. SKULL: No skull fracture. No significant scalp soft tissue swelling. SINUSES/ORBITS: The mastoid air-cells and included paranasal sinuses are well-aerated.The included ocular globes and orbital contents are non-suspicious. OTHER: None. CTA NECK FINDINGS: AORTIC ARCH: Normal appearance of the thoracic arch, normal branch pattern. Mild calcific atherosclerosis. The origins of the innominate, left Common carotid artery and subclavian artery are widely patent. RIGHT CAROTID SYSTEM: Common carotid artery is patent. Moderate calcific atherosclerosis of the carotid bifurcation without hemodynamically significant stenosis by NASCET criteria. Normal appearance of the internal carotid artery. LEFT CAROTID SYSTEM: Common carotid artery  is patent. Ectatic left carotid bifurcation with intimal thickening to lesser extent calcific atherosclerosis, no hemodynamically significant stenosis by NASCET criteria. Normal appearance of the internal carotid artery. VERTEBRAL ARTERIES:Right vertebral artery is dominant. Patent vertebral arteries, mild left luminal irregularity compatible with atherosclerosis. SKELETON: No acute osseous process though bone windows have not been submitted. Degenerative cervical spine resulting in severe left C5-6 and bilateral C6-7 neural foraminal narrowing. OTHER NECK: Soft tissues of the neck are nonacute though, not tailored for evaluation. UPPER CHEST: Moderate centrilobular emphysema. CTA HEAD FINDINGS: ANTERIOR CIRCULATION: Patent cervical internal carotid arteries, petrous, cavernous and supra clinoid internal carotid arteries. Patent anterior communicating artery. Patent anterior and middle cerebral arteries, mild-to-moderate luminal irregularity compatible with atherosclerosis. No large vessel occlusion, significant stenosis, contrast extravasation or aneurysm. POSTERIOR CIRCULATION: Patent vertebral arteries, vertebrobasilar junction and basilar artery, as well as main branch vessels. Left vertebral artery terminates in a posterior inferior cerebellar artery. Patent posterior cerebral arteries. Mild-to-moderate luminal irregularity posterior cerebral arteries compatible with atherosclerosis. Robust right posterior communicating artery present. No large vessel occlusion, significant stenosis, contrast extravasation or aneurysm. VENOUS SINUSES: Major dural venous sinuses are patent though not tailored for evaluation on this angiographic examination. ANATOMIC VARIANTS: None. DELAYED PHASE: No abnormal intracranial enhancement. MIP images reviewed. IMPRESSION: CT head: 1. No acute intracranial process. 2. 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 Old small right cerebellar infarct. CTA neck: 1. No hemodynamically  significant stenosis or acute vascular process. 2. Severe C5-6 and C6-7 neural foraminal narrowing. CTA head: 1. No emergent large vessel occlusion or flow-limiting stenosis. 2. Mild-to-moderate atherosclerosis. Aortic Atherosclerosis (ICD10-I70.0). Emphysema (ICD10-J43.9). Electronically Signed   By: Awilda Metro M.D.   On: 03/05/2018 22:51     ASSESSMENT AND PLAN  62 y.o. male past medical history of stroke, atrial fibrillation on Eliquis, COPD, hypertension, hyperlipidemia who presents to the emergency room after developing sudden onset weakness and numbness of his right arm. Given patient's multiple stroke risk factors, I suspect that this was a TIA.  Description of the event may suggest that this was also seizure-however his coworker denies rhythmic jerking.  Transient ischemic attack vs seizure  Recommend # MRI of the brain without contrast #Routine EEG  #CTA Head and neck  #Transthoracic Echo  # Continue Eliquis #Start or continue Atorvastatin 80 mg/other high intensity statin # BP goal: permissive HTN upto 220/120 mmHg # HBAIC and Lipid  profile # Telemetry monitoring # Frequent neuro checks # NPO until passes stroke swallow screen  Please page stroke NP  Or  PA  Or MD from 8am -4 pm  as this patient from this time will be  followed by the stroke.   You can look them up on [email or web address]  Password [facility]     Sushanth Aroor [facility] Pager Number [phone]

## 2018-03-05 NOTE — H&P (Addendum)
History and Physical    Keith Morton MWU:[id number] DOB: 1956-09-09 DOA: 03/05/2018  Referring MD/NP/PA:   PCP: No primary care provider on file.   Patient coming from:  The patient is coming from home.  At baseline, pt is independent for most of ADL.   Chief Complaint: Right arm numbness and confusion  HPI: Keith Morton is a 62 y.o. male with medical history significant of hypertension, hyperlipidemia, stroke, atrial fibrillation on Eliquis, tobacco abuse, right arm numbness.  Who presents with numbness and confusion.  Per report, pt was at was at work as a Nutritional therapist, he started having numbness and tingling in his right arm at about 18:50. Per EMP report, pt had some tremor to his right arm and then became confused. Pt was also combative on scene, incontinent of urine with small abrasion to tip of tongue. His mental status has gradually improved.  When I saw patient in ED, he is alert, oriented x3.  His right arm numbness has resolved.  Currently patient does not have unilateral numbness, weakness or tingling his extremities.  No facial droop or slurred speech. He denies any prior history of seizures and does not drink very frequently.  Patient does not have chest pain, shortness breath, nausea, vomiting, diarrhea, abdominal pain, symptoms of UTI.  ED Course: pt was found to have WBC 11.9, INR 1 0.20, negative troponin, negative urinalysis, ammonia level 34, acute renal injury with creatinine 1.66, BUN 26, potassium 3.0, temperature 99, tachycardia, oxygen saturation 100% on room air.  Chest x-ray negative for acute issues. CT head is negative for acute intracranial abnormalities, but showed old left temporoparietal/MCA territory infarct and old small right cerebellar infarct. CT angiogram of head and neck showed no LVO.  Patient is placed on telemetry bed for observation, neurology, Dr. Darrick Huntsman was consulted.  Review of Systems:   General: no fevers, chills, no body weight gain, has  fatigue HEENT: no blurry vision, hearing changes or sore throat Respiratory: no dyspnea, coughing, wheezing CV: no chest pain, no palpitations GI: no nausea, vomiting, abdominal pain, diarrhea, constipation GU: no dysuria, burning on urination, increased urinary frequency, hematuria  Ext: no leg edema Neuro: has right arm numbness and confusion. No vision change or hearing loss Skin: no rash, no skin tear. MSK: No muscle spasm, no deformity, no limitation of range of movement in spin Heme: No easy bruising.  Travel history: No recent long distant travel.  Allergy: No Known Allergies  Past Medical History:  Diagnosis Date  . Atrial fibrillation (HCC)   . COPD (chronic obstructive pulmonary disease) (HCC)   . Hypercholesteremia   . Hypertension   . Stroke [facility])     Past Surgical History:  Procedure Laterality Date  . APPENDECTOMY      Social History:  reports that he has been smoking.  He has never used smokeless tobacco. His alcohol and drug histories are not on file.  Family History:  Family History  Problem Relation Age of Onset  . Alcoholism Father      Prior to Admission medications   Medication Sig Start Date End Date Taking? Authorizing Provider  apixaban (ELIQUIS) 5 MG TABS tablet Take 5 mg by mouth 2 (two) times daily.    Yes [provider]  aspirin 81 MG chewable tablet Chew 81 mg by mouth daily.   Yes [provider]  atorvastatin (LIPITOR) 10 MG tablet Take 10 mg by mouth daily.   Yes [provider]  lisinopril (PRINIVIL,ZESTRIL) 10 MG tablet Take  10 mg by mouth daily.   Yes [provider]  metoprolol succinate (TOPROL-XL) 100 MG 24 hr tablet Take 100 mg by mouth daily. Take with or immediately following a meal.    Yes [provider]    Physical Exam: Vitals:   03/05/18 2315 03/05/18 2330 03/06/18 0000 03/06/18 0030  BP: (!) 117/91 (!) 126/96 104/69 102/72  Pulse:  81  74  Resp: (!) 24 (!) 23  20  Temp:     97.7 F (36.5 C)  TempSrc:    Oral  SpO2:  97%  99%  Weight:    79.4 kg (175 lb 0.7 oz)  Height:    5\' 10"  (1.778 m)   General: Not in acute distress HEENT:       Eyes: PERRL, EOMI, no scleral icterus.       ENT: No discharge from the ears and nose, no pharynx injection, no tonsillar enlargement.        Neck: No JVD, no bruit, no mass felt. Heme: No neck lymph node enlargement. Cardiac: S1/S2, irregularly irregular rhythm, No murmurs, No gallops or rubs. Respiratory:  No rales, wheezing, rhonchi or rubs. GI: Soft, nondistended, nontender, no rebound pain, no organomegaly, BS present. GU: No hematuria Ext: No pitting leg edema bilaterally. 2+DP/PT pulse bilaterally. Musculoskeletal: No joint deformities, No joint redness or warmth, no limitation of ROM in spin. Skin: No rashes.  Neuro: currently pt is alert, oriented X3, cranial nerves II-XII grossly intact, moves all extremities normally. Muscle strength 5/5 in all extremities, sensation to light touch intact. Brachial reflex 2+ bilaterally. Negative Babinski's sign.  Psych: Patient is not psychotic, no suicidal or hemocidal ideation.  Labs on Admission: I have personally reviewed following labs and imaging studies  CBC: Recent Labs  Lab 03/05/18 2037  WBC 11.9*  NEUTROABS 9.6*  HGB 14.1  HCT 43.0  MCV 92.9  PLT 262   Basic Metabolic Panel: Recent Labs  Lab 03/05/18 2037  NA 138  K 3.0*  CL 106  CO2 23  GLUCOSE 120*  BUN 26*  CREATININE 1.66*  CALCIUM 9.4   GFR: Estimated Creatinine Clearance: 47 mL/min (A) (by C-G formula based on SCr of 1.66 mg/dL (H)). Liver Function Tests: Recent Labs  Lab 03/05/18 2037  AST 23  ALT 15*  ALKPHOS 84  BILITOT 0.6  PROT 7.2  ALBUMIN 3.6   No results for input(s): LIPASE, AMYLASE in the last 168 hours. Recent Labs  Lab 03/05/18 2022  AMMONIA 34   Coagulation Profile: Recent Labs  Lab 03/05/18 2037  INR 1.29   Cardiac Enzymes: No results for input(s): CKTOTAL,  CKMB, CKMBINDEX, TROPONINI in the last 168 hours. BNP (last 3 results) No results for input(s): PROBNP in the last 8760 hours. HbA1C: No results for input(s): HGBA1C in the last 72 hours. CBG: Recent Labs  Lab 03/05/18 2120  GLUCAP 103*   Lipid Profile: No results for input(s): CHOL, HDL, LDLCALC, TRIG, CHOLHDL, LDLDIRECT in the last 72 hours. Thyroid Function Tests: No results for input(s): TSH, T4TOTAL, FREET4, T3FREE, THYROIDAB in the last 72 hours. Anemia Panel: No results for input(s): VITAMINB12, FOLATE, FERRITIN, TIBC, IRON, RETICCTPCT in the last 72 hours. Urine analysis:    Component Value Date/Time   COLORURINE STRAW (A) 03/05/2018 2123   APPEARANCEUR CLEAR 03/05/2018 2123   LABSPEC 1.009 03/05/2018 2123   PHURINE 5.0 03/05/2018 2123   GLUCOSEU NEGATIVE 03/05/2018 2123   HGBUR NEGATIVE 03/05/2018 2123   BILIRUBINUR NEGATIVE 03/05/2018 2123  KETONESUR NEGATIVE 03/05/2018 2123   PROTEINUR NEGATIVE 03/05/2018 2123   NITRITE NEGATIVE 03/05/2018 2123   LEUKOCYTESUR NEGATIVE 03/05/2018 2123   Sepsis Labs: @LABRCNTIP (procalcitonin:4,lacticidven:4) )No results found for this or any previous visit (from the past 240 hour(s)).   Radiological Exams on Admission: Ct Angio Head W Or Wo Contrast  Result Date: 03/05/2018 CLINICAL DATA:  Near syncope, right arm trembling. History of hypertension, hypercholesterolemia, atrial fibrillation and stroke. EXAM: CT ANGIOGRAPHY HEAD AND NECK TECHNIQUE: Multidetector CT imaging of the head and neck was performed using the standard protocol during bolus administration of intravenous contrast. Multiplanar CT image reconstructions and MIPs were obtained to evaluate the vascular anatomy. Carotid stenosis measurements (when applicable) are obtained utilizing NASCET criteria, using the distal internal carotid diameter as the denominator. CONTRAST:  50mL ISOVUE-370 IOPAMIDOL (ISOVUE-370) INJECTION 76% COMPARISON:  None. FINDINGS: CT HEAD FINDINGS  BRAIN: No intraparenchymal hemorrhage, mass effect nor midline shift. Old small right cerebellar infarct. Left parietotemporal encephalomalacia. Mild ex vacuo dilatation left lateral ventricle. No hydrocephalus. Patchy supratentorial white matter FLAIR T2 hyperintensities compatible mild chronic small vessel ischemic changes. No acute large vascular territory infarcts. No abnormal extra-axial fluid collections. Basal cisterns are patent. VASCULAR: Mild calcific atherosclerosis of the carotid siphons. SKULL: No skull fracture. No significant scalp soft tissue swelling. SINUSES/ORBITS: The mastoid air-cells and included paranasal sinuses are well-aerated.The included ocular globes and orbital contents are non-suspicious. OTHER: None. CTA NECK FINDINGS: AORTIC ARCH: Normal appearance of the thoracic arch, normal branch pattern. Mild calcific atherosclerosis. The origins of the innominate, left Common carotid artery and subclavian artery are widely patent. RIGHT CAROTID SYSTEM: Common carotid artery is patent. Moderate calcific atherosclerosis of the carotid bifurcation without hemodynamically significant stenosis by NASCET criteria. Normal appearance of the internal carotid artery. LEFT CAROTID SYSTEM: Common carotid artery is patent. Ectatic left carotid bifurcation with intimal thickening to lesser extent calcific atherosclerosis, no hemodynamically significant stenosis by NASCET criteria. Normal appearance of the internal carotid artery. VERTEBRAL ARTERIES:Right vertebral artery is dominant. Patent vertebral arteries, mild left luminal irregularity compatible with atherosclerosis. SKELETON: No acute osseous process though bone windows have not been submitted. Degenerative cervical spine resulting in severe left C5-6 and bilateral C6-7 neural foraminal narrowing. OTHER NECK: Soft tissues of the neck are nonacute though, not tailored for evaluation. UPPER CHEST: Moderate centrilobular emphysema. CTA HEAD FINDINGS:  ANTERIOR CIRCULATION: Patent cervical internal carotid arteries, petrous, cavernous and supra clinoid internal carotid arteries. Patent anterior communicating artery. Patent anterior and middle cerebral arteries, mild-to-moderate luminal irregularity compatible with atherosclerosis. No large vessel occlusion, significant stenosis, contrast extravasation or aneurysm. POSTERIOR CIRCULATION: Patent vertebral arteries, vertebrobasilar junction and basilar artery, as well as main branch vessels. Left vertebral artery terminates in a posterior inferior cerebellar artery. Patent posterior cerebral arteries. Mild-to-moderate luminal irregularity posterior cerebral arteries compatible with atherosclerosis. Robust right posterior communicating artery present. No large vessel occlusion, significant stenosis, contrast extravasation or aneurysm. VENOUS SINUSES: Major dural venous sinuses are patent though not tailored for evaluation on this angiographic examination. ANATOMIC VARIANTS: None. DELAYED PHASE: No abnormal intracranial enhancement. MIP images reviewed. IMPRESSION: CT head: 1. No acute intracranial process. 2. 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 Old small right cerebellar infarct. CTA neck: 1. No hemodynamically significant stenosis or acute vascular process. 2. Severe C5-6 and C6-7 neural foraminal narrowing. CTA head: 1. No emergent large vessel occlusion or flow-limiting stenosis. 2. Mild-to-moderate atherosclerosis. Aortic Atherosclerosis (ICD10-I70.0). Emphysema (ICD10-J43.9). Electronically Signed   By: Awilda Metroourtnay  Bloomer M.D.   On: 03/05/2018 22:51  Dg Chest 2 View  Result Date: 03/05/2018 CLINICAL DATA:  Acute confusion. EXAM: CHEST - 2 VIEW COMPARISON:  None. FINDINGS: The cardiomediastinal silhouette is unremarkable. Mild peribronchial thickening noted. There is no evidence of focal airspace disease, pulmonary edema, suspicious pulmonary nodule/mass, pleural effusion, or pneumothorax. No acute  bony abnormalities are identified. IMPRESSION: No active cardiopulmonary disease. Electronically Signed   By: Harmon Pier M.D.   On: 03/05/2018 21:21   Ct Head Wo Contrast  Result Date: 03/05/2018 CLINICAL DATA:  Near syncope, right arm trembling. History of hypertension, hypercholesterolemia, atrial fibrillation and stroke. EXAM: CT ANGIOGRAPHY HEAD AND NECK TECHNIQUE: Multidetector CT imaging of the head and neck was performed using the standard protocol during bolus administration of intravenous contrast. Multiplanar CT image reconstructions and MIPs were obtained to evaluate the vascular anatomy. Carotid stenosis measurements (when applicable) are obtained utilizing NASCET criteria, using the distal internal carotid diameter as the denominator. CONTRAST:  50mL ISOVUE-370 IOPAMIDOL (ISOVUE-370) INJECTION 76% COMPARISON:  None. FINDINGS: CT HEAD FINDINGS BRAIN: No intraparenchymal hemorrhage, mass effect nor midline shift. Old small right cerebellar infarct. Left parietotemporal encephalomalacia. Mild ex vacuo dilatation left lateral ventricle. No hydrocephalus. Patchy supratentorial white matter FLAIR T2 hyperintensities compatible mild chronic small vessel ischemic changes. No acute large vascular territory infarcts. No abnormal extra-axial fluid collections. Basal cisterns are patent. VASCULAR: Mild calcific atherosclerosis of the carotid siphons. SKULL: No skull fracture. No significant scalp soft tissue swelling. SINUSES/ORBITS: The mastoid air-cells and included paranasal sinuses are well-aerated.The included ocular globes and orbital contents are non-suspicious. OTHER: None. CTA NECK FINDINGS: AORTIC ARCH: Normal appearance of the thoracic arch, normal branch pattern. Mild calcific atherosclerosis. The origins of the innominate, left Common carotid artery and subclavian artery are widely patent. RIGHT CAROTID SYSTEM: Common carotid artery is patent. Moderate calcific atherosclerosis of the carotid  bifurcation without hemodynamically significant stenosis by NASCET criteria. Normal appearance of the internal carotid artery. LEFT CAROTID SYSTEM: Common carotid artery is patent. Ectatic left carotid bifurcation with intimal thickening to lesser extent calcific atherosclerosis, no hemodynamically significant stenosis by NASCET criteria. Normal appearance of the internal carotid artery. VERTEBRAL ARTERIES:Right vertebral artery is dominant. Patent vertebral arteries, mild left luminal irregularity compatible with atherosclerosis. SKELETON: No acute osseous process though bone windows have not been submitted. Degenerative cervical spine resulting in severe left C5-6 and bilateral C6-7 neural foraminal narrowing. OTHER NECK: Soft tissues of the neck are nonacute though, not tailored for evaluation. UPPER CHEST: Moderate centrilobular emphysema. CTA HEAD FINDINGS: ANTERIOR CIRCULATION: Patent cervical internal carotid arteries, petrous, cavernous and supra clinoid internal carotid arteries. Patent anterior communicating artery. Patent anterior and middle cerebral arteries, mild-to-moderate luminal irregularity compatible with atherosclerosis. No large vessel occlusion, significant stenosis, contrast extravasation or aneurysm. POSTERIOR CIRCULATION: Patent vertebral arteries, vertebrobasilar junction and basilar artery, as well as main branch vessels. Left vertebral artery terminates in a posterior inferior cerebellar artery. Patent posterior cerebral arteries. Mild-to-moderate luminal irregularity posterior cerebral arteries compatible with atherosclerosis. Robust right posterior communicating artery present. No large vessel occlusion, significant stenosis, contrast extravasation or aneurysm. VENOUS SINUSES: Major dural venous sinuses are patent though not tailored for evaluation on this angiographic examination. ANATOMIC VARIANTS: None. DELAYED PHASE: No abnormal intracranial enhancement. MIP images reviewed.  IMPRESSION: CT head: 1. No acute intracranial process. 2. 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 Old small right cerebellar infarct. CTA neck: 1. No hemodynamically significant stenosis or acute vascular process. 2. Severe C5-6 and C6-7 neural foraminal narrowing. CTA head: 1. No emergent large vessel occlusion or  flow-limiting stenosis. 2. Mild-to-moderate atherosclerosis. Aortic Atherosclerosis (ICD10-I70.0). Emphysema (ICD10-J43.9). Electronically Signed   By: Awilda Metro M.D.   On: 03/05/2018 22:51   Ct Angio Neck W And/or Wo Contrast  Result Date: 03/05/2018 CLINICAL DATA:  Near syncope, right arm trembling. History of hypertension, hypercholesterolemia, atrial fibrillation and stroke. EXAM: CT ANGIOGRAPHY HEAD AND NECK TECHNIQUE: Multidetector CT imaging of the head and neck was performed using the standard protocol during bolus administration of intravenous contrast. Multiplanar CT image reconstructions and MIPs were obtained to evaluate the vascular anatomy. Carotid stenosis measurements (when applicable) are obtained utilizing NASCET criteria, using the distal internal carotid diameter as the denominator. CONTRAST:  50mL ISOVUE-370 IOPAMIDOL (ISOVUE-370) INJECTION 76% COMPARISON:  None. FINDINGS: CT HEAD FINDINGS BRAIN: No intraparenchymal hemorrhage, mass effect nor midline shift. Old small right cerebellar infarct. Left parietotemporal encephalomalacia. Mild ex vacuo dilatation left lateral ventricle. No hydrocephalus. Patchy supratentorial white matter FLAIR T2 hyperintensities compatible mild chronic small vessel ischemic changes. No acute large vascular territory infarcts. No abnormal extra-axial fluid collections. Basal cisterns are patent. VASCULAR: Mild calcific atherosclerosis of the carotid siphons. SKULL: No skull fracture. No significant scalp soft tissue swelling. SINUSES/ORBITS: The mastoid air-cells and included paranasal sinuses are well-aerated.The included ocular globes  and orbital contents are non-suspicious. OTHER: None. CTA NECK FINDINGS: AORTIC ARCH: Normal appearance of the thoracic arch, normal branch pattern. Mild calcific atherosclerosis. The origins of the innominate, left Common carotid artery and subclavian artery are widely patent. RIGHT CAROTID SYSTEM: Common carotid artery is patent. Moderate calcific atherosclerosis of the carotid bifurcation without hemodynamically significant stenosis by NASCET criteria. Normal appearance of the internal carotid artery. LEFT CAROTID SYSTEM: Common carotid artery is patent. Ectatic left carotid bifurcation with intimal thickening to lesser extent calcific atherosclerosis, no hemodynamically significant stenosis by NASCET criteria. Normal appearance of the internal carotid artery. VERTEBRAL ARTERIES:Right vertebral artery is dominant. Patent vertebral arteries, mild left luminal irregularity compatible with atherosclerosis. SKELETON: No acute osseous process though bone windows have not been submitted. Degenerative cervical spine resulting in severe left C5-6 and bilateral C6-7 neural foraminal narrowing. OTHER NECK: Soft tissues of the neck are nonacute though, not tailored for evaluation. UPPER CHEST: Moderate centrilobular emphysema. CTA HEAD FINDINGS: ANTERIOR CIRCULATION: Patent cervical internal carotid arteries, petrous, cavernous and supra clinoid internal carotid arteries. Patent anterior communicating artery. Patent anterior and middle cerebral arteries, mild-to-moderate luminal irregularity compatible with atherosclerosis. No large vessel occlusion, significant stenosis, contrast extravasation or aneurysm. POSTERIOR CIRCULATION: Patent vertebral arteries, vertebrobasilar junction and basilar artery, as well as main branch vessels. Left vertebral artery terminates in a posterior inferior cerebellar artery. Patent posterior cerebral arteries. Mild-to-moderate luminal irregularity posterior cerebral arteries compatible with  atherosclerosis. Robust right posterior communicating artery present. No large vessel occlusion, significant stenosis, contrast extravasation or aneurysm. VENOUS SINUSES: Major dural venous sinuses are patent though not tailored for evaluation on this angiographic examination. ANATOMIC VARIANTS: None. DELAYED PHASE: No abnormal intracranial enhancement. MIP images reviewed. IMPRESSION: CT head: 1. No acute intracranial process. 2. 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 Old small right cerebellar infarct. CTA neck: 1. No hemodynamically significant stenosis or acute vascular process. 2. Severe C5-6 and C6-7 neural foraminal narrowing. CTA head: 1. No emergent large vessel occlusion or flow-limiting stenosis. 2. Mild-to-moderate atherosclerosis. Aortic Atherosclerosis (ICD10-I70.0). Emphysema (ICD10-J43.9). Electronically Signed   By: Awilda Metro M.D.   On: 03/05/2018 22:51     EKG: Independently reviewed.  Atrial fibrillation, QTc 421, Q waves in lead III/aVF, early R wave progression  Assessment/Plan Principal Problem:   Right arm numbness Active Problems:   Atrial fibrillation (HCC)   COPD (chronic obstructive pulmonary disease) (HCC)   Hypercholesteremia   Hypertension   Stroke (HCC)   Acute metabolic encephalopathy   Hypokalemia   AKI (acute kidney injury) (HCC)   Leukocytosis   Right arm numbness: pt also had possible right arm tremor per report.  Etiology is not clear.  Differential diagnosis include TIA, seizure, drug abuse.  CT head is negative for acute intracranial abnormalities.  CT angiogram did not show LVO.  Neurology, Dr. Wilford Corner was consulted.  - will admit to tele bed  - will follow up Neurology's Recs.  - Obtain MRI and EEG - Continue Eliquis ASA  - fasting lipid panel and HbA1c  - 2D transthoracic echocardiography  - Check UDS  - PT/OT consult  Atrial Fibrillation: CHA2DS2-VASc Score is 3, needs oral anticoagulation. Patient is on Eliquis at home.  Heart rate is well controlled. -Continue Eliquis and metoprolol   COPD (chronic obstructive pulmonary disease) (HCC): s nebulizerable -prn albuterol nebulizers  HLD: -lipitor  HTN:  -hold lisinopril due to acute renal injury -Continue metoprolol -IV hydralazine prn  Hx of Stroke [facility]): -on Eliquis, Lipitor, aspirin  Acute metabolic encephalopathy: Has resolved. -Frequent neuro check  Hypokalemia: K=3.0 on admission. - Repleted - Check Mg level  AKI: Likely due to prerenal secondary to dehydration and continuation of ACEI - IVF: 1L NS and then 75 cc/h - Follow up renal function by BMP - Check FeNa  - Hold lisinopril  Leukocytosis: mild, WBC 11.9. UA negative and CXR negative.  -f/u Bx and Ux.   DVT ppx: on eliquis Code Status: Full code Family Communication: None at bed side.    Disposition Plan:  Anticipate discharge back to previous home environment Consults called:  Dr. Darrick Huntsman of Neurology Admission status: Obs / tele        Date of Service 03/06/2018    Lorretta Harp [facility] Pager [phone]  If 7PM-7AM, please contact night-coverage [email or web address] Password [email or web address] 03/06/2018, 12:46 AM

## 2018-03-06 ENCOUNTER — Observation Stay (HOSPITAL_COMMUNITY): Payer: Medicaid Other

## 2018-03-06 ENCOUNTER — Observation Stay (HOSPITAL_BASED_OUTPATIENT_CLINIC_OR_DEPARTMENT_OTHER): Payer: Medicaid Other

## 2018-03-06 ENCOUNTER — Encounter (HOSPITAL_COMMUNITY): Payer: Self-pay

## 2018-03-06 ENCOUNTER — Other Ambulatory Visit: Payer: Self-pay

## 2018-03-06 DIAGNOSIS — I482 Chronic atrial fibrillation: Secondary | ICD-10-CM | POA: Diagnosis not present

## 2018-03-06 DIAGNOSIS — R200 Anesthesia of skin: Secondary | ICD-10-CM

## 2018-03-06 DIAGNOSIS — J449 Chronic obstructive pulmonary disease, unspecified: Secondary | ICD-10-CM

## 2018-03-06 DIAGNOSIS — I4891 Unspecified atrial fibrillation: Secondary | ICD-10-CM | POA: Diagnosis not present

## 2018-03-06 DIAGNOSIS — G9341 Metabolic encephalopathy: Secondary | ICD-10-CM | POA: Diagnosis not present

## 2018-03-06 DIAGNOSIS — E876 Hypokalemia: Secondary | ICD-10-CM | POA: Diagnosis not present

## 2018-03-06 DIAGNOSIS — I10 Essential (primary) hypertension: Secondary | ICD-10-CM | POA: Diagnosis not present

## 2018-03-06 DIAGNOSIS — N179 Acute kidney failure, unspecified: Secondary | ICD-10-CM | POA: Diagnosis not present

## 2018-03-06 DIAGNOSIS — I639 Cerebral infarction, unspecified: Secondary | ICD-10-CM | POA: Diagnosis not present

## 2018-03-06 LAB — LIPID PANEL
Cholesterol: 63 mg/dL (ref 0–200)
HDL: 22 mg/dL — ABNORMAL LOW (ref >40)
LDL Cholesterol: 28 mg/dL (ref 0–99)
TRIGLYCERIDES: 67 mg/dL (ref <150)
Total CHOL/HDL Ratio: 2.9 RATIO
VLDL: 13 mg/dL (ref 0–40)

## 2018-03-06 LAB — HEMOGLOBIN A1C
Hgb A1c MFr Bld: 5.9 % — ABNORMAL HIGH (ref 4.8–5.6)
MEAN PLASMA GLUCOSE: 122.63 mg/dL

## 2018-03-06 LAB — SODIUM, URINE, RANDOM: Sodium, Ur: 64 mmol/L

## 2018-03-06 LAB — GLUCOSE, CAPILLARY: Glucose-Capillary: 95 mg/dL (ref 65–99)

## 2018-03-06 LAB — HIV ANTIBODY (ROUTINE TESTING W REFLEX): HIV Screen 4th Generation wRfx: NONREACTIVE

## 2018-03-06 LAB — ECHOCARDIOGRAM COMPLETE
HEIGHTINCHES: 70 in
WEIGHTICAEL: 2800.72 [oz_av]

## 2018-03-06 LAB — CREATININE, URINE, RANDOM: Creatinine, Urine: 38.02 mg/dL

## 2018-03-06 LAB — MAGNESIUM: MAGNESIUM: 1.6 mg/dL — AB (ref 1.7–2.4)

## 2018-03-06 MED ORDER — POTASSIUM CHLORIDE CRYS ER 20 MEQ PO TBCR
40.0000 meq | EXTENDED_RELEASE_TABLET | Freq: Once | ORAL | Status: AC
  Administered 2018-03-06: 40 meq via ORAL
  Filled 2018-03-06: qty 2

## 2018-03-06 MED ORDER — TRAZODONE HCL 50 MG PO TABS: 50.0000 mg | ORAL_TABLET | Freq: Every evening | ORAL | Status: DC | PRN

## 2018-03-06 MED ORDER — ALBUTEROL SULFATE (2.5 MG/3ML) 0.083% IN NEBU: 2.5000 mg | INHALATION_SOLUTION | Freq: Four times a day (QID) | RESPIRATORY_TRACT | Status: DC | PRN

## 2018-03-06 MED ORDER — MAGNESIUM OXIDE 400 (241.3 MG) MG PO TABS
800.0000 mg | ORAL_TABLET | Freq: Once | ORAL | Status: AC
  Administered 2018-03-06: 800 mg via ORAL
  Filled 2018-03-06: qty 2

## 2018-03-06 NOTE — Progress Notes (Signed)
Physical Therapy Evaluation Patient Details Name: Faythe Casaeill Riviello MRN: [id number] DOB: 11/28/1955 Today's Date: 03/06/2018   History of Present Illness  Patient is a 62 y/o male admitted on 03/05/18 due to R arm numbness and confusion. 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 Patient with a PMH significant for HTN, CVA, A Fib, tobacco abuse, R arm numbness.  Clinical Impression  Mr. Lilian KapurMcDonald admitted with the above listed diagnosis. 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 Patient today only requiring SUP for transfers and mobility for general safety as patient is slightly impulsive but with no LOB. Patient with subjective reports of feeling that he is at his baseline level of functioning. All education completed with no identified PT needs at this time. PT to sign off.     Follow Up Recommendations No PT follow up    Equipment Recommendations  None recommended by PT    Recommendations for Other Services       Precautions / Restrictions Precautions Precautions: Fall Restrictions Weight Bearing Restrictions: No      Mobility  Bed Mobility Overal bed mobility: Modified Independent             General bed mobility comments: slightly impulsive with quick movements  Transfers Overall transfer level: Needs assistance Equipment used: None Transfers: Sit to/from Stand Sit to Stand: Supervision         General transfer comment: SUP for safety  Ambulation/Gait Ambulation/Gait assistance: Supervision Ambulation Distance (Feet): 100 Feet Assistive device: None Gait Pattern/deviations: Step-through pattern;Drifts right/left;Narrow base of support     General Gait Details: very quick pace - reports this is his baseline  Careers information officertairs            Wheelchair Mobility    Modified Rankin (Stroke Patients Only)       Balance Overall balance assessment: Mild  deficits observed, not formally tested                                           Pertinent Vitals/Pain Pain Assessment: No/denies pain    Home Living Family/patient expects to be discharged to:: Private residence Living Arrangements: Spouse/significant other   Type of Home: House Home Access: Stairs to enter   Secretary/administratorntrance Stairs-Number of Steps: 2 Home Layout: One level Home Equipment: None      Prior Function Level of Independence: Independent         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, retailplumber     Hand Dominance        Extremity/Trunk Assessment        Lower Extremity Assessment Lower Extremity Assessment: Overall WFL for tasks assessed       Communication   Communication: No difficulties  Cognition Arousal/Alertness: Awake/alert Behavior During Therapy: WFL for tasks assessed/performed Overall Cognitive Status: Within Functional Limits for tasks assessed                                        General Comments      Exercises     Assessment/Plan    PT Assessment Patent does not need any further PT services  PT Problem List         PT Treatment Interventions      PT Goals (Current goals  can be found in the Care Plan section)  Acute Rehab PT Goals Patient Stated Goal: return home PT Goal Formulation: With patient Time For Goal Achievement: 03/13/18 Potential to Achieve Goals: Good    Frequency     Barriers to discharge        Co-evaluation               AM-PAC PT "6 Clicks" Daily Activity  Outcome Measure Difficulty turning over in bed (including adjusting bedclothes, sheets and blankets)?: None Difficulty moving from lying on back to sitting on the side of the bed? : None Difficulty sitting down on and standing up from a chair with arms (e.g., wheelchair, bedside commode, etc,.)?: A Little Help needed moving to and from a bed to chair (including a wheelchair)?: A Little Help needed walking in hospital room?:  A Little Help needed climbing 3-5 steps with a railing? : A Little 6 Click Score: 20    End of Session Equipment Utilized During Treatment: Gait belt Activity Tolerance: Patient tolerated treatment well Patient left: in bed;with call bell/phone within reach;with family/visitor present Nurse Communication: Mobility status PT Visit Diagnosis: Unsteadiness on feet (R26.81);Other abnormalities of gait and mobility (R26.89)    Time: 4098-1191 PT Time Calculation (min) (ACUTE ONLY): 18 min   Charges:   PT Evaluation $PT Eval Moderate Complexity: 1 Mod     PT G Codes:        Kipp Laurence, PT, DPT 03/06/18 12:23 PM

## 2018-03-06 NOTE — Progress Notes (Signed)
EEG Complete. Results pending. 

## 2018-03-06 NOTE — Care Management Note (Signed)
Case Management Note  Patient Details  Name: Keith Morton MRN: [id number] Date of Birth: 12/05/1955  Subjective/Objective:    Pt admitted with rt arm numbness. He is from home with his spouse.             PC[facility]add[facility] Insurance: Medicaid  Action/Plan: No f/u per PT and no DME needs. Pt has transportation home.    Expected Discharge Date:  03/06/18               Expected Discharge Plan:  Home/Self Care  In-House Referral:     Discharge planning Services     Post Acute Care Choice:    Choice offered to:     DME Arranged:    DME Agency:     HH Arranged:    HH Agency:     Status of Service:  Completed, signed off  If discussed at [employer] Length of Stay Meetings, dates discussed:    Additional Comments:  Kermit BaloKelli F Sabel Hornbeck, RN 03/06/2018, 2:06 PM

## 2018-03-06 NOTE — Progress Notes (Signed)
SLP Cancellation Note  Patient Details Name: Keith Morton MRN: [id number] DOB: 04/16/1956   Cancelled treatment:       Reason Eval/Treat Not Completed: SLP screened, no needs identified, will sign off   Blenda MountsCouture, Jadence Kinlaw Laurice 03/06/2018, 11:46 AM

## 2018-03-06 NOTE — Procedures (Signed)
ELECTROENCEPHALOGRAM REPORT  Date of Study: 03/06/2018  Patient's Name: Keith Morton MRN: [id number] Date of Birth: 04/17/1956  Referring Provider: Dr. Lorretta HarpXilin Niu  Clinical History: This is a 62 year old man with transient right arm weakness and numbness.  Medications: No AEDs or sedation listed  Technical Summary: A multichannel digital EEG recording measured by the international 10-20 system with electrodes applied with paste and impedances below 5000 ohms performed in our laboratory with EKG monitoring in an awake and asleep patient.  Hyperventilation and photic stimulation were not performed.  The digital EEG was referentially recorded, reformatted, and digitally filtered in a variety of bipolar and referential montages for optimal display.    Description: The patient is awake and asleep during the recording.  During maximal wakefulness, there is a symmetric, medium voltage 8 Hz posterior dominant rhythm that attenuates with eye opening.  The record is symmetric.  During drowsiness and sleep, there is an increase in theta slowing of the background.  Vertex waves and symmetric sleep spindles were seen.  Hyperventilation and photic stimulation were not performed.  There were no epileptiform discharges or electrographic seizures seen.    EKG lead showed irregular rhythm.  Impression: This awake and asleep EEG is normal.    Clinical Correlation: A normal EEG does not exclude a clinical diagnosis of epilepsy.Clinical correlation is advised.   Patrcia DollyKaren Jacorey Donaway, M.D.

## 2018-03-06 NOTE — Discharge Instructions (Addendum)

## 2018-03-06 NOTE — Progress Notes (Addendum)
STROKE TEAM PROGRESS NOTE  Just back from EEG.  His wife is at the bedside.    Patient reports ongoing intermittent aphasia since initial stroke years prior.  Overall, he commonly gets confused between left and right, north or south, hot or cold.  He has had 2 episodes of confusional spells since intital stroke, one 2 months ago that lasted 15 to 30 minutes and one yesterday.  He does admit to working very hard, up to 12 hours a day, and being stressed physically during his work.  His wife reports she and his family are trying to get him to slow down but he is resistant.  Vitals:   03/06/18 0225 03/06/18 0434 03/06/18 0648 03/06/18 0816  BP: 94/68 100/75 113/75 100/70  Pulse: 63   (!) 56  Resp: (!) 24 (!) 24 (!) 26 16  Temp: 98 F (36.7 C) 97.7 F (36.5 C) 97.6 F (36.4 C)   TempSrc: Oral Oral Oral   SpO2: 99% 97% 100% 92%  Weight:      Height:        CBC:  Recent Labs  Lab 03/05/18 2037  WBC 11.9*  NEUTROABS 9.6*  HGB 14.1  HCT 43.0  MCV 92.9  PLT 262    Basic Metabolic Panel:  Recent Labs  Lab 03/05/18 2037 03/06/18 0551  NA 138  --   K 3.0*  --   CL 106  --   CO2 23  --   GLUCOSE 120*  --   BUN 26*  --   CREATININE 1.66*  --   CALCIUM 9.4  --   MG  --  1.6*   Lipid Panel:     Component Value Date/Time   CHOL 63 03/06/2018 0551   TRIG 67 03/06/2018 0551   HDL 22 (L) 03/06/2018 0551   CHOLHDL 2.9 03/06/2018 0551   VLDL 13 03/06/2018 0551   LDLCALC 28 03/06/2018 0551   HgbA1c:  Lab Results  Component Value Date   HGBA1C 5.9 (H) 03/06/2018   Urine Drug Screen: No results found for: LABOPIA, COCAINSCRNUR, LABBENZ, AMPHETMU, THCU, LABBARB  Alcohol Level No results found for: ETH  IMAGING Ct Angio Head W Or Wo Contrast  Result Date: 03/05/2018 CLINICAL DATA:  Near syncope, right arm trembling. History of hypertension, hypercholesterolemia, atrial fibrillation and stroke. EXAM: CT ANGIOGRAPHY HEAD AND NECK TECHNIQUE: Multidetector CT imaging of the head  and neck was performed using the standard protocol during bolus administration of intravenous contrast. Multiplanar CT image reconstructions and MIPs were obtained to evaluate the vascular anatomy. Carotid stenosis measurements (when applicable) are obtained utilizing NASCET criteria, using the distal internal carotid diameter as the denominator. CONTRAST:  50mL ISOVUE-370 IOPAMIDOL (ISOVUE-370) INJECTION 76% COMPARISON:  None. FINDINGS: CT HEAD FINDINGS BRAIN: No intraparenchymal hemorrhage, mass effect nor midline shift. Old small right cerebellar infarct. Left parietotemporal encephalomalacia. Mild ex vacuo dilatation left lateral ventricle. No hydrocephalus. Patchy supratentorial white matter FLAIR T2 hyperintensities compatible mild chronic small vessel ischemic changes. No acute large vascular territory infarcts. No abnormal extra-axial fluid collections. Basal cisterns are patent. VASCULAR: Mild calcific atherosclerosis of the carotid siphons. SKULL: No skull fracture. No significant scalp soft tissue swelling. SINUSES/ORBITS: The mastoid air-cells and included paranasal sinuses are well-aerated.The included ocular globes and orbital contents are non-suspicious. OTHER: None. CTA NECK FINDINGS: AORTIC ARCH: Normal appearance of the thoracic arch, normal branch pattern. Mild calcific atherosclerosis. The origins of the innominate, left Common carotid artery and subclavian artery are widely patent. RIGHT CAROTID  SYSTEM: Common carotid artery is patent. Moderate calcific atherosclerosis of the carotid bifurcation without hemodynamically significant stenosis by NASCET criteria. Normal appearance of the internal carotid artery. LEFT CAROTID SYSTEM: Common carotid artery is patent. Ectatic left carotid bifurcation with intimal thickening to lesser extent calcific atherosclerosis, no hemodynamically significant stenosis by NASCET criteria. Normal appearance of the internal carotid artery. VERTEBRAL ARTERIES:Right  vertebral artery is dominant. Patent vertebral arteries, mild left luminal irregularity compatible with atherosclerosis. SKELETON: No acute osseous process though bone windows have not been submitted. Degenerative cervical spine resulting in severe left C5-6 and bilateral C6-7 neural foraminal narrowing. OTHER NECK: Soft tissues of the neck are nonacute though, not tailored for evaluation. UPPER CHEST: Moderate centrilobular emphysema. CTA HEAD FINDINGS: ANTERIOR CIRCULATION: Patent cervical internal carotid arteries, petrous, cavernous and supra clinoid internal carotid arteries. Patent anterior communicating artery. Patent anterior and middle cerebral arteries, mild-to-moderate luminal irregularity compatible with atherosclerosis. No large vessel occlusion, significant stenosis, contrast extravasation or aneurysm. POSTERIOR CIRCULATION: Patent vertebral arteries, vertebrobasilar junction and basilar artery, as well as main branch vessels. Left vertebral artery terminates in a posterior inferior cerebellar artery. Patent posterior cerebral arteries. Mild-to-moderate luminal irregularity posterior cerebral arteries compatible with atherosclerosis. Robust right posterior communicating artery present. No large vessel occlusion, significant stenosis, contrast extravasation or aneurysm. VENOUS SINUSES: Major dural venous sinuses are patent though not tailored for evaluation on this angiographic examination. ANATOMIC VARIANTS: None. DELAYED PHASE: No abnormal intracranial enhancement. MIP images reviewed. IMPRESSION: CT head: 1. No acute intracranial process. 2. 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 Old small right cerebellar infarct. CTA neck: 1. No hemodynamically significant stenosis or acute vascular process. 2. Severe C5-6 and C6-7 neural foraminal narrowing. CTA head: 1. No emergent large vessel occlusion or flow-limiting stenosis. 2. Mild-to-moderate atherosclerosis. Aortic Atherosclerosis (ICD10-I70.0).  Emphysema (ICD10-J43.9). Electronically Signed   By: Awilda Metro M.D.   On: 03/05/2018 22:51   Dg Chest 2 View  Result Date: 03/05/2018 CLINICAL DATA:  Acute confusion. EXAM: CHEST - 2 VIEW COMPARISON:  None. FINDINGS: The cardiomediastinal silhouette is unremarkable. Mild peribronchial thickening noted. There is no evidence of focal airspace disease, pulmonary edema, suspicious pulmonary nodule/mass, pleural effusion, or pneumothorax. No acute bony abnormalities are identified. IMPRESSION: No active cardiopulmonary disease. Electronically Signed   By: Harmon Pier M.D.   On: 03/05/2018 21:21   Ct Head Wo Contrast  Result Date: 03/05/2018 CLINICAL DATA:  Near syncope, right arm trembling. History of hypertension, hypercholesterolemia, atrial fibrillation and stroke. EXAM: CT ANGIOGRAPHY HEAD AND NECK TECHNIQUE: Multidetector CT imaging of the head and neck was performed using the standard protocol during bolus administration of intravenous contrast. Multiplanar CT image reconstructions and MIPs were obtained to evaluate the vascular anatomy. Carotid stenosis measurements (when applicable) are obtained utilizing NASCET criteria, using the distal internal carotid diameter as the denominator. CONTRAST:  50mL ISOVUE-370 IOPAMIDOL (ISOVUE-370) INJECTION 76% COMPARISON:  None. FINDINGS: CT HEAD FINDINGS BRAIN: No intraparenchymal hemorrhage, mass effect nor midline shift. Old small right cerebellar infarct. Left parietotemporal encephalomalacia. Mild ex vacuo dilatation left lateral ventricle. No hydrocephalus. Patchy supratentorial white matter FLAIR T2 hyperintensities compatible mild chronic small vessel ischemic changes. No acute large vascular territory infarcts. No abnormal extra-axial fluid collections. Basal cisterns are patent. VASCULAR: Mild calcific atherosclerosis of the carotid siphons. SKULL: No skull fracture. No significant scalp soft tissue swelling. SINUSES/ORBITS: The mastoid air-cells and  included paranasal sinuses are well-aerated.The included ocular globes and orbital contents are non-suspicious. OTHER: None. CTA NECK FINDINGS: AORTIC ARCH: Normal appearance of  the thoracic arch, normal branch pattern. Mild calcific atherosclerosis. The origins of the innominate, left Common carotid artery and subclavian artery are widely patent. RIGHT CAROTID SYSTEM: Common carotid artery is patent. Moderate calcific atherosclerosis of the carotid bifurcation without hemodynamically significant stenosis by NASCET criteria. Normal appearance of the internal carotid artery. LEFT CAROTID SYSTEM: Common carotid artery is patent. Ectatic left carotid bifurcation with intimal thickening to lesser extent calcific atherosclerosis, no hemodynamically significant stenosis by NASCET criteria. Normal appearance of the internal carotid artery. VERTEBRAL ARTERIES:Right vertebral artery is dominant. Patent vertebral arteries, mild left luminal irregularity compatible with atherosclerosis. SKELETON: No acute osseous process though bone windows have not been submitted. Degenerative cervical spine resulting in severe left C5-6 and bilateral C6-7 neural foraminal narrowing. OTHER NECK: Soft tissues of the neck are nonacute though, not tailored for evaluation. UPPER CHEST: Moderate centrilobular emphysema. CTA HEAD FINDINGS: ANTERIOR CIRCULATION: Patent cervical internal carotid arteries, petrous, cavernous and supra clinoid internal carotid arteries. Patent anterior communicating artery. Patent anterior and middle cerebral arteries, mild-to-moderate luminal irregularity compatible with atherosclerosis. No large vessel occlusion, significant stenosis, contrast extravasation or aneurysm. POSTERIOR CIRCULATION: Patent vertebral arteries, vertebrobasilar junction and basilar artery, as well as main branch vessels. Left vertebral artery terminates in a posterior inferior cerebellar artery. Patent posterior cerebral arteries.  Mild-to-moderate luminal irregularity posterior cerebral arteries compatible with atherosclerosis. Robust right posterior communicating artery present. No large vessel occlusion, significant stenosis, contrast extravasation or aneurysm. VENOUS SINUSES: Major dural venous sinuses are patent though not tailored for evaluation on this angiographic examination. ANATOMIC VARIANTS: None. DELAYED PHASE: No abnormal intracranial enhancement. MIP images reviewed. IMPRESSION: CT head: 1. No acute intracranial process. 2. 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 Old small right cerebellar infarct. CTA neck: 1. No hemodynamically significant stenosis or acute vascular process. 2. Severe C5-6 and C6-7 neural foraminal narrowing. CTA head: 1. No emergent large vessel occlusion or flow-limiting stenosis. 2. Mild-to-moderate atherosclerosis. Aortic Atherosclerosis (ICD10-I70.0). Emphysema (ICD10-J43.9). Electronically Signed   By: Awilda Metro M.D.   On: 03/05/2018 22:51   Ct Angio Neck W And/or Wo Contrast  Result Date: 03/05/2018 CLINICAL DATA:  Near syncope, right arm trembling. History of hypertension, hypercholesterolemia, atrial fibrillation and stroke. EXAM: CT ANGIOGRAPHY HEAD AND NECK TECHNIQUE: Multidetector CT imaging of the head and neck was performed using the standard protocol during bolus administration of intravenous contrast. Multiplanar CT image reconstructions and MIPs were obtained to evaluate the vascular anatomy. Carotid stenosis measurements (when applicable) are obtained utilizing NASCET criteria, using the distal internal carotid diameter as the denominator. CONTRAST:  50mL ISOVUE-370 IOPAMIDOL (ISOVUE-370) INJECTION 76% COMPARISON:  None. FINDINGS: CT HEAD FINDINGS BRAIN: No intraparenchymal hemorrhage, mass effect nor midline shift. Old small right cerebellar infarct. Left parietotemporal encephalomalacia. Mild ex vacuo dilatation left lateral ventricle. No hydrocephalus. Patchy  supratentorial white matter FLAIR T2 hyperintensities compatible mild chronic small vessel ischemic changes. No acute large vascular territory infarcts. No abnormal extra-axial fluid collections. Basal cisterns are patent. VASCULAR: Mild calcific atherosclerosis of the carotid siphons. SKULL: No skull fracture. No significant scalp soft tissue swelling. SINUSES/ORBITS: The mastoid air-cells and included paranasal sinuses are well-aerated.The included ocular globes and orbital contents are non-suspicious. OTHER: None. CTA NECK FINDINGS: AORTIC ARCH: Normal appearance of the thoracic arch, normal branch pattern. Mild calcific atherosclerosis. The origins of the innominate, left Common carotid artery and subclavian artery are widely patent. RIGHT CAROTID SYSTEM: Common carotid artery is patent. Moderate calcific atherosclerosis of the carotid bifurcation without hemodynamically significant stenosis by NASCET criteria. Normal  appearance of the internal carotid artery. LEFT CAROTID SYSTEM: Common carotid artery is patent. Ectatic left carotid bifurcation with intimal thickening to lesser extent calcific atherosclerosis, no hemodynamically significant stenosis by NASCET criteria. Normal appearance of the internal carotid artery. VERTEBRAL ARTERIES:Right vertebral artery is dominant. Patent vertebral arteries, mild left luminal irregularity compatible with atherosclerosis. SKELETON: No acute osseous process though bone windows have not been submitted. Degenerative cervical spine resulting in severe left C5-6 and bilateral C6-7 neural foraminal narrowing. OTHER NECK: Soft tissues of the neck are nonacute though, not tailored for evaluation. UPPER CHEST: Moderate centrilobular emphysema. CTA HEAD FINDINGS: ANTERIOR CIRCULATION: Patent cervical internal carotid arteries, petrous, cavernous and supra clinoid internal carotid arteries. Patent anterior communicating artery. Patent anterior and middle cerebral arteries,  mild-to-moderate luminal irregularity compatible with atherosclerosis. No large vessel occlusion, significant stenosis, contrast extravasation or aneurysm. POSTERIOR CIRCULATION: Patent vertebral arteries, vertebrobasilar junction and basilar artery, as well as main branch vessels. Left vertebral artery terminates in a posterior inferior cerebellar artery. Patent posterior cerebral arteries. Mild-to-moderate luminal irregularity posterior cerebral arteries compatible with atherosclerosis. Robust right posterior communicating artery present. No large vessel occlusion, significant stenosis, contrast extravasation or aneurysm. VENOUS SINUSES: Major dural venous sinuses are patent though not tailored for evaluation on this angiographic examination. ANATOMIC VARIANTS: None. DELAYED PHASE: No abnormal intracranial enhancement. MIP images reviewed. IMPRESSION: CT head: 1. No acute intracranial process. 2. 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 Old small right cerebellar infarct. CTA neck: 1. No hemodynamically significant stenosis or acute vascular process. 2. Severe C5-6 and C6-7 neural foraminal narrowing. CTA head: 1. No emergent large vessel occlusion or flow-limiting stenosis. 2. Mild-to-moderate atherosclerosis. Aortic Atherosclerosis (ICD10-I70.0). Emphysema (ICD10-J43.9). Electronically Signed   By: Awilda Metro M.D.   On: 03/05/2018 22:51    PHYSICAL EXAM General: Appears well-developed and well-nourished.  Psych: Affect appropriate to situation Eyes: No scleral injection HENT: No OP obstrucion Head: Normocephalic.  Cardiovascular: Normal rate and regular rhythm.  Respiratory: Effort normal and breath sounds normal to anterior ascultation Skin: WDI  Neurological Examination Mental Status: Alert, oriented, thought content appropriate.  Speech fluent without evidence of aphasia. Able to follow 2 step commands without difficulty. Got command " point to the ceiling  after you point to the  floor" inaccurate.  Cranial Nerves: II: Visual fields grossly normal,  III,IV, VI: ptosis not present, extra-ocular motions intact bilaterally, pupils equal, round, reactive to light and accommodation V,VII: smile symmetric, facial light touch sensation normal bilaterally VIII: hearing normal bilaterally IX,X: uvula rises symmetrically XI: bilateral shoulder shrug XII: midline tongue extension Motor: Right :  Upper extremity   5/5                                      Left:     Upper extremity   5/5             Lower extremity   5/5                                                  Lower extremity   5/5 Tone and bulk:normal tone throughout; no atrophy noted Sensory: Pinprick and light touch intact throughout, bilaterally Deep Tendon Reflexes: 1+ and symmetric throughout Plantars: Right: downgoing  Left: downgoing Cerebellar: normal finger-to-nose, normal rapid alternating movements and normal heel-to-shin test Gait: normal gait and station   ASSESSMENT/PLAN Mr. Keith Morton is a 62 y.o. male with history of hypertension, hyperlipidemia, atrial fibrillation on Eliquis and aspirin, previous stroke due to atrial fibrillation presenting with confusion..   Recrudescence of previous stroke symptoms (aphasia) in setting of stress   CT head  No acute abnormality.  Old left temporoparietal/MCA infarct.  Old small right cerebellar infarct.    CTA head no ELVO.  Mild to moderate atherosclerosis  CTA neck no significant stenosis.  C5-6 and C6-7 foraminal narrowing  MRI no acute stroke.  Old left parietal infarct.  Old small right cerebellar infarct  2D Echo EF 65-70%   EEG normal  LDL 28  HgbA1c 5.9  No VTE prophylaxis needed  aspirin 81 mg daily and Eliquis (apixaban) daily prior to admission, now on aspirin 81 mg daily and Eliquis (apixaban) daily. Continue at d/c  Therapy recommendations: No therapy needs  Disposition: Return home with  wife Stroke team will sign off. Follow up in the office in 4 weeks. Orders placed.  Hypertension  Stable . 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 I have made any additions or clarifications directly to the above note. Pt was seen and examined.   62 year old male with history of A. fib on Eliquis, stroke 4 years ago due to A. fib not on anticoagulation, hypertension, hyperlipidemia admitted for right-sided numbness and weakness with tremor and confusion.  However on history taking, he denies any right-sided numbness or weakness, but admitted that he had word finding difficulties and anxiety.  EEG normal.  CT showed old left MCA infarct, consistent with history of stroke due to A. fib without  anticoagulation  CTA head and neck unremarkable, only had left nondominant VA with origin stenosis and terminated at PICA.  MRI no acute infarct, but left MCA old infarct.  LDL 28 and A1c 5.9.  Creatinine 1.66.  WBC 11.9.    Patient symptoms most likely due to recrudescence of previous stroke symptoms in the setting of anxiety/stress.  However, simple partial seizure due to left MCA stroke is also possible, however EEG negative.  Patient has been on Eliquis, aspirin 81 and Lipitor 10 PTA, will continue same on discharge.  Continue stroke risk factor modification.  Neurology will sign off. Please call with questions. Pt will follow up with stroke clinic NP at [facility] in about 4 weeks. Thanks for the  consult.   Marvel Plan, MD PhD Stroke Neurology 03/06/2018 9:45 PM     To contact Stroke Continuity provider, please refer to [email or web address]. After hours, contact General Neurology

## 2018-03-06 NOTE — Progress Notes (Signed)
  Echocardiogram 2D Echocardiogram has been performed.  Celene SkeenVijay  Annis Lagoy 03/06/2018, 3:08 PM

## 2018-03-06 NOTE — Progress Notes (Signed)
OT Cancellation Note  Patient Details Name: Keith Morton MRN: [id number] DOB: 06/30/1956   Cancelled Treatment:    Reason Eval/Treat Not Completed: OT screened, no needs identified, will sign off [facility] no acute needs at thi stime  Felecia ShellingJones, Aleasha Fregeau B   Gwen Sarvis, Brynn   OTR/L Pager: [phone] Office: [phone] .  03/06/2018, 2:01 PM

## 2018-03-07 NOTE — Discharge Summary (Signed)
Physician Discharge Summary  Surya Schroeter WUJ:[id number] DOB: [birth year] DOA: 03/05/2018  PCP: No primary care provider on file.  Admit date: 03/05/2018 Discharge date: 03/06/2018  Admitted From: Home.  Disposition:  Home.   Recommendations for Outpatient Follow-up:  1. Follow up with PCP in 1-2 weeks 2. Please obtain BMP/CBC in one week Please follow up with neurology as recommended.    Discharge Condition:stable.  CODE STATUS: full code.  Diet recommendation: Heart Healthy  Brief/Interim Summary: Keith Morton is a 62 y.o. male with medical history significant of hypertension, hyperlipidemia, stroke, atrial fibrillation on Eliquis, tobacco abuse, right arm numbness.  Who presents with numbness and confusion    Discharge Diagnoses:  Principal Problem:   Right arm numbness Active Problems:   Atrial fibrillation (HCC)   COPD (chronic obstructive pulmonary disease) (HCC)   Hypercholesteremia   Hypertension   Stroke (HCC)   Acute metabolic encephalopathy   Hypokalemia   AKI (acute kidney injury) (HCC)   Leukocytosis  Right arm numbness: resolved.    Acute encephalopathy:  Unclear etiology.  ? Seizures. CT angiogram did not show LVO.  Neurology consulted.  MRI negative.  EEG does not show any epileptiform activity.  hgba1c is 5.9.  Echo done and results pending.  Resume ELIQUIS for the afib.    Atrial fibrillation:  Rate controlled.  On eliquis and metoprolol for rate control.    COPD: STABLE.    Hyperlipidemia: ldl is 28.   Hypokalemia and hypomagnesemia Replaced. Recommend outpatient follow up with a repeat BMP In one week.    H/o stroke On eliquis, lipitor.    Hypertension:  Well controlled.    AKI vs CKD: Creatinine 1.6 on admission.  Recommend outpatient follow up with a BMP in one week.     Discharge Instructions  Discharge Instructions    Ambulatory referral to Neurology   Complete by:  As directed    Follow up with stroke clinic  NP (Jessica Vanschaick or Darrol Angel, if both not available, consider Dr. Delia Heady, Dr. Jamelle Rushing, or Dr. Naomie Dean) at [facility] in about 4 weeks.   Diet - low sodium heart healthy   Complete by:  As directed    Discharge instructions   Complete by:  As directed    Please follow up with Neurology as recommended.     Allergies as of 03/06/2018   No Known Allergies     Medication List    TAKE these medications   aspirin 81 MG chewable tablet Chew 81 mg by mouth daily.   atorvastatin 10 MG tablet Commonly known as:  LIPITOR Take 10 mg by mouth daily.   ELIQUIS 5 MG Tabs tablet Generic drug:  apixaban Take 5 mg by mouth 2 (two) times daily.   lisinopril 10 MG tablet Commonly known as:  PRINIVIL,ZESTRIL Take 10 mg by mouth daily.   metoprolol succinate 100 MG 24 hr tablet Commonly known as:  TOPROL-XL Take 100 mg by mouth daily. Take with or immediately following a meal.      Follow-up Information    [facility] Follow up in 4 week(s).   Specialty:  Neurology Why:  Stroke clinic.  Office will call with appointment date and time. Contact information: [address] [place] [address] [phone]       [facility] .   Specialty:  Cardiology Contact information: [address] [id number] mc [place] [address] [phone]  [facility] .   Specialty:  Radiology Contact information: [address] [id number] [address] [address] [place] [address] [phone]         No Known Allergies  Consultations:  Neurology.    Procedures/Studies: Ct Angio Head W Or Wo Contrast  Result Date: 03/05/2018 CLINICAL DATA:  Near syncope, right arm trembling. History of hypertension, hypercholesterolemia, atrial fibrillation and stroke. EXAM: CT ANGIOGRAPHY HEAD AND NECK TECHNIQUE: Multidetector CT imaging of  the head and neck was performed using the standard protocol during bolus administration of intravenous contrast. Multiplanar CT image reconstructions and MIPs were obtained to evaluate the vascular anatomy. Carotid stenosis measurements (when applicable) are obtained utilizing NASCET criteria, using the distal internal carotid diameter as the denominator. CONTRAST:  50mL ISOVUE-370 IOPAMIDOL (ISOVUE-370) INJECTION 76% COMPARISON:  None. FINDINGS: CT HEAD FINDINGS BRAIN: No intraparenchymal hemorrhage, mass effect nor midline shift. Old small right cerebellar infarct. Left parietotemporal encephalomalacia. Mild ex vacuo dilatation left lateral ventricle. No hydrocephalus. Patchy supratentorial white matter FLAIR T2 hyperintensities compatible mild chronic small vessel ischemic changes. No acute large vascular territory infarcts. No abnormal extra-axial fluid collections. Basal cisterns are patent. VASCULAR: Mild calcific atherosclerosis of the carotid siphons. SKULL: No skull fracture. No significant scalp soft tissue swelling. SINUSES/ORBITS: The mastoid air-cells and included paranasal sinuses are well-aerated.The included ocular globes and orbital contents are non-suspicious. OTHER: None. CTA NECK FINDINGS: AORTIC ARCH: Normal appearance of the thoracic arch, normal branch pattern. Mild calcific atherosclerosis. The origins of the innominate, left Common carotid artery and subclavian artery are widely patent. RIGHT CAROTID SYSTEM: Common carotid artery is patent. Moderate calcific atherosclerosis of the carotid bifurcation without hemodynamically significant stenosis by NASCET criteria. Normal appearance of the internal carotid artery. LEFT CAROTID SYSTEM: Common carotid artery is patent. Ectatic left carotid bifurcation with intimal thickening to lesser extent calcific atherosclerosis, no hemodynamically significant stenosis by NASCET criteria. Normal appearance of the internal carotid artery. VERTEBRAL  ARTERIES:Right vertebral artery is dominant. Patent vertebral arteries, mild left luminal irregularity compatible with atherosclerosis. SKELETON: No acute osseous process though bone windows have not been submitted. Degenerative cervical spine resulting in severe left C5-6 and bilateral C6-7 neural foraminal narrowing. OTHER NECK: Soft tissues of the neck are nonacute though, not tailored for evaluation. UPPER CHEST: Moderate centrilobular emphysema. CTA HEAD FINDINGS: ANTERIOR CIRCULATION: Patent cervical internal carotid arteries, petrous, cavernous and supra clinoid internal carotid arteries. Patent anterior communicating artery. Patent anterior and middle cerebral arteries, mild-to-moderate luminal irregularity compatible with atherosclerosis. No large vessel occlusion, significant stenosis, contrast extravasation or aneurysm. POSTERIOR CIRCULATION: Patent vertebral arteries, vertebrobasilar junction and basilar artery, as well as main branch vessels. Left vertebral artery terminates in a posterior inferior cerebellar artery. Patent posterior cerebral arteries. Mild-to-moderate luminal irregularity posterior cerebral arteries compatible with atherosclerosis. Robust right posterior communicating artery present. No large vessel occlusion, significant stenosis, contrast extravasation or aneurysm. VENOUS SINUSES: Major dural venous sinuses are patent though not tailored for evaluation on this angiographic examination. ANATOMIC VARIANTS: None. DELAYED PHASE: No abnormal intracranial enhancement. MIP images reviewed. IMPRESSION: CT head: 1. No acute intracranial process. 2. 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 Old small right cerebellar infarct. CTA neck: 1. No hemodynamically significant stenosis or acute vascular process. 2. Severe C5-6 and C6-7 neural foraminal narrowing. CTA head: 1. No emergent large vessel occlusion or flow-limiting stenosis. 2. Mild-to-moderate atherosclerosis. Aortic Atherosclerosis  (ICD10-I70.0). Emphysema (ICD10-J43.9). Electronically Signed   By: Awilda Metro M.D.   On: 03/05/2018 22:51   Dg Chest 2 View  Result Date: 03/05/2018 CLINICAL DATA:  Acute confusion. EXAM: CHEST - 2 VIEW COMPARISON:  None. FINDINGS: The cardiomediastinal silhouette is unremarkable. Mild peribronchial thickening noted. There is no evidence of focal airspace disease, pulmonary edema, suspicious pulmonary nodule/mass, pleural effusion, or pneumothorax. No acute bony abnormalities are identified. IMPRESSION: No active cardiopulmonary disease. Electronically Signed   By: Harmon Pier M.D.   On: 03/05/2018 21:21   Ct Head Wo Contrast  Result Date: 03/05/2018 CLINICAL DATA:  Near syncope, right arm trembling. History of hypertension, hypercholesterolemia, atrial fibrillation and stroke. EXAM: CT ANGIOGRAPHY HEAD AND NECK TECHNIQUE: Multidetector CT imaging of the head and neck was performed using the standard protocol during bolus administration of intravenous contrast. Multiplanar CT image reconstructions and MIPs were obtained to evaluate the vascular anatomy. Carotid stenosis measurements (when applicable) are obtained utilizing NASCET criteria, using the distal internal carotid diameter as the denominator. CONTRAST:  50mL ISOVUE-370 IOPAMIDOL (ISOVUE-370) INJECTION 76% COMPARISON:  None. FINDINGS: CT HEAD FINDINGS BRAIN: No intraparenchymal hemorrhage, mass effect nor midline shift. Old small right cerebellar infarct. Left parietotemporal encephalomalacia. Mild ex vacuo dilatation left lateral ventricle. No hydrocephalus. Patchy supratentorial white matter FLAIR T2 hyperintensities compatible mild chronic small vessel ischemic changes. No acute large vascular territory infarcts. No abnormal extra-axial fluid collections. Basal cisterns are patent. VASCULAR: Mild calcific atherosclerosis of the carotid siphons. SKULL: No skull fracture. No significant scalp soft tissue swelling. SINUSES/ORBITS: The mastoid  air-cells and included paranasal sinuses are well-aerated.The included ocular globes and orbital contents are non-suspicious. OTHER: None. CTA NECK FINDINGS: AORTIC ARCH: Normal appearance of the thoracic arch, normal branch pattern. Mild calcific atherosclerosis. The origins of the innominate, left Common carotid artery and subclavian artery are widely patent. RIGHT CAROTID SYSTEM: Common carotid artery is patent. Moderate calcific atherosclerosis of the carotid bifurcation without hemodynamically significant stenosis by NASCET criteria. Normal appearance of the internal carotid artery. LEFT CAROTID SYSTEM: Common carotid artery is patent. Ectatic left carotid bifurcation with intimal thickening to lesser extent calcific atherosclerosis, no hemodynamically significant stenosis by NASCET criteria. Normal appearance of the internal carotid artery. VERTEBRAL ARTERIES:Right vertebral artery is dominant. Patent vertebral arteries, mild left luminal irregularity compatible with atherosclerosis. SKELETON: No acute osseous process though bone windows have not been submitted. Degenerative cervical spine resulting in severe left C5-6 and bilateral C6-7 neural foraminal narrowing. OTHER NECK: Soft tissues of the neck are nonacute though, not tailored for evaluation. UPPER CHEST: Moderate centrilobular emphysema. CTA HEAD FINDINGS: ANTERIOR CIRCULATION: Patent cervical internal carotid arteries, petrous, cavernous and supra clinoid internal carotid arteries. Patent anterior communicating artery. Patent anterior and middle cerebral arteries, mild-to-moderate luminal irregularity compatible with atherosclerosis. No large vessel occlusion, significant stenosis, contrast extravasation or aneurysm. POSTERIOR CIRCULATION: Patent vertebral arteries, vertebrobasilar junction and basilar artery, as well as main branch vessels. Left vertebral artery terminates in a posterior inferior cerebellar artery. Patent posterior cerebral  arteries. Mild-to-moderate luminal irregularity posterior cerebral arteries compatible with atherosclerosis. Robust right posterior communicating artery present. No large vessel occlusion, significant stenosis, contrast extravasation or aneurysm. VENOUS SINUSES: Major dural venous sinuses are patent though not tailored for evaluation on this angiographic examination. ANATOMIC VARIANTS: None. DELAYED PHASE: No abnormal intracranial enhancement. MIP images reviewed. IMPRESSION: CT head: 1. No acute intracranial process. 2. 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 Old small right cerebellar infarct. CTA neck: 1. No hemodynamically significant stenosis or acute vascular process. 2. Severe C5-6 and C6-7 neural foraminal narrowing. CTA head: 1. No emergent large vessel occlusion or flow-limiting stenosis. 2. Mild-to-moderate atherosclerosis. Aortic  Atherosclerosis (ICD10-I70.0). Emphysema (ICD10-J43.9). Electronically Signed   By: Awilda Metro M.D.   On: 03/05/2018 22:51   Ct Angio Neck W And/or Wo Contrast  Result Date: 03/05/2018 CLINICAL DATA:  Near syncope, right arm trembling. History of hypertension, hypercholesterolemia, atrial fibrillation and stroke. EXAM: CT ANGIOGRAPHY HEAD AND NECK TECHNIQUE: Multidetector CT imaging of the head and neck was performed using the standard protocol during bolus administration of intravenous contrast. Multiplanar CT image reconstructions and MIPs were obtained to evaluate the vascular anatomy. Carotid stenosis measurements (when applicable) are obtained utilizing NASCET criteria, using the distal internal carotid diameter as the denominator. CONTRAST:  50mL ISOVUE-370 IOPAMIDOL (ISOVUE-370) INJECTION 76% COMPARISON:  None. FINDINGS: CT HEAD FINDINGS BRAIN: No intraparenchymal hemorrhage, mass effect nor midline shift. Old small right cerebellar infarct. Left parietotemporal encephalomalacia. Mild ex vacuo dilatation left lateral ventricle. No hydrocephalus. Patchy  supratentorial white matter FLAIR T2 hyperintensities compatible mild chronic small vessel ischemic changes. No acute large vascular territory infarcts. No abnormal extra-axial fluid collections. Basal cisterns are patent. VASCULAR: Mild calcific atherosclerosis of the carotid siphons. SKULL: No skull fracture. No significant scalp soft tissue swelling. SINUSES/ORBITS: The mastoid air-cells and included paranasal sinuses are well-aerated.The included ocular globes and orbital contents are non-suspicious. OTHER: None. CTA NECK FINDINGS: AORTIC ARCH: Normal appearance of the thoracic arch, normal branch pattern. Mild calcific atherosclerosis. The origins of the innominate, left Common carotid artery and subclavian artery are widely patent. RIGHT CAROTID SYSTEM: Common carotid artery is patent. Moderate calcific atherosclerosis of the carotid bifurcation without hemodynamically significant stenosis by NASCET criteria. Normal appearance of the internal carotid artery. LEFT CAROTID SYSTEM: Common carotid artery is patent. Ectatic left carotid bifurcation with intimal thickening to lesser extent calcific atherosclerosis, no hemodynamically significant stenosis by NASCET criteria. Normal appearance of the internal carotid artery. VERTEBRAL ARTERIES:Right vertebral artery is dominant. Patent vertebral arteries, mild left luminal irregularity compatible with atherosclerosis. SKELETON: No acute osseous process though bone windows have not been submitted. Degenerative cervical spine resulting in severe left C5-6 and bilateral C6-7 neural foraminal narrowing. OTHER NECK: Soft tissues of the neck are nonacute though, not tailored for evaluation. UPPER CHEST: Moderate centrilobular emphysema. CTA HEAD FINDINGS: ANTERIOR CIRCULATION: Patent cervical internal carotid arteries, petrous, cavernous and supra clinoid internal carotid arteries. Patent anterior communicating artery. Patent anterior and middle cerebral arteries,  mild-to-moderate luminal irregularity compatible with atherosclerosis. No large vessel occlusion, significant stenosis, contrast extravasation or aneurysm. POSTERIOR CIRCULATION: Patent vertebral arteries, vertebrobasilar junction and basilar artery, as well as main branch vessels. Left vertebral artery terminates in a posterior inferior cerebellar artery. Patent posterior cerebral arteries. Mild-to-moderate luminal irregularity posterior cerebral arteries compatible with atherosclerosis. Robust right posterior communicating artery present. No large vessel occlusion, significant stenosis, contrast extravasation or aneurysm. VENOUS SINUSES: Major dural venous sinuses are patent though not tailored for evaluation on this angiographic examination. ANATOMIC VARIANTS: None. DELAYED PHASE: No abnormal intracranial enhancement. MIP images reviewed. IMPRESSION: CT head: 1. No acute intracranial process. 2. 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 Old small right cerebellar infarct. CTA neck: 1. No hemodynamically significant stenosis or acute vascular process. 2. Severe C5-6 and C6-7 neural foraminal narrowing. CTA head: 1. No emergent large vessel occlusion or flow-limiting stenosis. 2. Mild-to-moderate atherosclerosis. Aortic Atherosclerosis (ICD10-I70.0). Emphysema (ICD10-J43.9). Electronically Signed   By: Awilda Metro M.D.   On: 03/05/2018 22:51   Mr Brain Wo Contrast  Result Date: 03/06/2018 CLINICAL DATA:  Right arm shaking with weakness and numbness EXAM: MRI HEAD WITHOUT CONTRAST TECHNIQUE: Multiplanar, multiecho pulse  sequences of the brain and surrounding structures were obtained without intravenous contrast. COMPARISON:  Head CT and CTA from yesterday FINDINGS: Brain: No acute infarct, hemorrhage, hydrocephalus, or masslike finding. Remote left parietal infarct with dense encephalomalacia/gliosis and hemosiderin staining. Small remote right cerebellar infarct. T2 hyperintensity in the bilateral  globus pallidus without volume loss, likely early mineralization. Vascular: Major flow voids are preserved. Skull and upper cervical spine: No evidence of marrow lesion. Sinuses/Orbits: Negative IMPRESSION: 1. No acute finding. 2. Remote left parietal infarct which could serve as a seizure focus. 3. Small remote infarct in the right cerebellum. Electronically Signed   By: Marnee Spring M.D.   On: 03/06/2018 10:47       Subjective: No chest pain, sob, headache or dizziness. He is alert and oriented.   Discharge Exam: Vitals:   03/06/18 0816 03/06/18 1149  BP: 100/70 99/64  Pulse: (!) 56 60  Resp: 16 18  Temp:  98 F (36.7 C)  SpO2: 92% 100%   Vitals:   03/06/18 0434 03/06/18 0648 03/06/18 0816 03/06/18 1149  BP: 100/75 113/75 100/70 99/64  Pulse:   (!) 56 60  Resp: (!) 24 (!) 26 16 18   Temp: 97.7 F (36.5 C) 97.6 F (36.4 C)  98 F (36.7 C)  TempSrc: Oral Oral  Oral  SpO2: 97% 100% 92% 100%  Weight:      Height:        General: Pt is alert, awake, not in acute distress Cardiovascular: RRR, S1/S2 +, no rubs, no gallops Respiratory: CTA bilaterally, no wheezing, no rhonchi Abdominal: Soft, NT, ND, bowel sounds + Extremities: no edema, no cyanosis    The results of significant diagnostics from this hospitalization (including imaging, microbiology, ancillary and laboratory) are listed below for reference.     Microbiology: No results found for this or any previous visit (from the past 240 hour(s)).   Labs: BNP (last 3 results) No results for input(s): BNP in the last 8760 hours. Basic Metabolic Panel: Recent Labs  Lab 03/05/18 2037 03/06/18 0551  NA 138  --   K 3.0*  --   CL 106  --   CO2 23  --   GLUCOSE 120*  --   BUN 26*  --   CREATININE 1.66*  --   CALCIUM 9.4  --   MG  --  1.6*   Liver Function Tests: Recent Labs  Lab 03/05/18 2037  AST 23  ALT 15*  ALKPHOS 84  BILITOT 0.6  PROT 7.2  ALBUMIN 3.6   No results for input(s): LIPASE, AMYLASE  in the last 168 hours. Recent Labs  Lab 03/05/18 2022  AMMONIA 34   CBC: Recent Labs  Lab 03/05/18 2037  WBC 11.9*  NEUTROABS 9.6*  HGB 14.1  HCT 43.0  MCV 92.9  PLT 262   Cardiac Enzymes: No results for input(s): CKTOTAL, CKMB, CKMBINDEX, TROPONINI in the last 168 hours. BNP: Invalid input(s): POCBNP CBG: Recent Labs  Lab 03/05/18 2120 03/06/18 1145  GLUCAP 103* 95   D-Dimer No results for input(s): DDIMER in the last 72 hours. Hgb A1c Recent Labs    03/06/18 0551  HGBA1C 5.9*   Lipid Profile Recent Labs    03/06/18 0551  CHOL 63  HDL 22*  LDLCALC 28  TRIG 67  CHOLHDL 2.9   Thyroid function studies No results for input(s): TSH, T4TOTAL, T3FREE, THYROIDAB in the last 72 hours.  Invalid input(s): FREET3 Anemia work up No results for input(s): VITAMINB12, FOLATE,  FERRITIN, TIBC, IRON, RETICCTPCT in the last 72 hours. Urinalysis    Component Value Date/Time   COLORURINE STRAW (A) 03/05/2018 2123   APPEARANCEUR CLEAR 03/05/2018 2123   LABSPEC 1.009 03/05/2018 2123   PHURINE 5.0 03/05/2018 2123   GLUCOSEU NEGATIVE 03/05/2018 2123   HGBUR NEGATIVE 03/05/2018 2123   BILIRUBINUR NEGATIVE 03/05/2018 2123   KETONESUR NEGATIVE 03/05/2018 2123   PROTEINUR NEGATIVE 03/05/2018 2123   NITRITE NEGATIVE 03/05/2018 2123   LEUKOCYTESUR NEGATIVE 03/05/2018 2123   Sepsis Labs Invalid input(s): PROCALCITONIN,  WBC,  LACTICIDVEN Microbiology No results found for this or any previous visit (from the past 240 hour(s)).   Time coordinating discharge: 32 minutes  SIGNED:   Kathlen ModyVijaya Marijke Guadiana, MD  [facility] 03/07/2018, 10:02 AM Pager   If 7PM-7AM, please contact night-coverage [email or web address] Password [email or web address]

## 2018-03-11 ENCOUNTER — Encounter: Payer: Self-pay | Admitting: General Practice

## 2018-03-11 LAB — CULTURE, BLOOD (ROUTINE X 2)
CULTURE: NO GROWTH
Culture: NO GROWTH

## 2018-03-24 MED ORDER — FLUTICASONE 250 MCG-SALMETEROL 50 MCG/DOSE BLISTR POWDR FOR INHALATION
RESPIRATORY_TRACT | 3 refills | 0 days
Start: 2018-03-24 — End: 2019-04-21

## 2018-03-26 ENCOUNTER — Ambulatory Visit
Admit: 2018-03-26 | Discharge: 2018-03-27 | Payer: MEDICARE | Attending: Colon & Rectal Surgery | Primary: Colon & Rectal Surgery

## 2018-03-26 DIAGNOSIS — K603 Anal fistula: Principal | ICD-10-CM

## 2018-04-13 ENCOUNTER — Ambulatory Visit: Admit: 2018-04-13 | Discharge: 2018-04-14 | Payer: MEDICARE

## 2018-04-13 DIAGNOSIS — I482 Chronic atrial fibrillation, unspecified: Principal | ICD-10-CM

## 2018-04-13 MED ORDER — METOPROLOL TARTRATE 100 MG TABLET
ORAL_TABLET | 3 refills | 0 days
Start: 2018-04-13 — End: ?

## 2018-06-03 MED ORDER — GABAPENTIN 600 MG TABLET
ORAL_TABLET | 3 refills | 0 days
Start: 2018-06-03 — End: 2018-10-20

## 2018-06-03 MED ORDER — ATORVASTATIN 40 MG TABLET
ORAL_TABLET | 3 refills | 0 days
Start: 2018-06-03 — End: ?

## 2018-06-03 MED ORDER — GABAPENTIN 300 MG CAPSULE
ORAL_CAPSULE | 1 refills | 0 days
Start: 2018-06-03 — End: 2018-12-17

## 2018-06-15 ENCOUNTER — Ambulatory Visit
Admit: 2018-06-15 | Discharge: 2018-06-16 | Payer: MEDICARE | Attending: Colon & Rectal Surgery | Primary: Colon & Rectal Surgery

## 2018-06-15 DIAGNOSIS — K603 Anal fistula: Principal | ICD-10-CM

## 2018-07-17 ENCOUNTER — Ambulatory Visit: Admit: 2018-07-17 | Discharge: 2018-07-18 | Payer: MEDICARE

## 2018-07-17 DIAGNOSIS — I4819 Other persistent atrial fibrillation: Principal | ICD-10-CM

## 2018-09-10 ENCOUNTER — Encounter: Payer: Self-pay | Admitting: General Practice

## 2018-09-25 MED ORDER — ELIQUIS 5 MG TABLET: 5 mg | tablet | Freq: Two times a day (BID) | 2 refills | 0 days | Status: AC

## 2018-09-25 MED ORDER — ELIQUIS 5 MG TABLET
ORAL_TABLET | Freq: Two times a day (BID) | ORAL | 0 refills | 0.00000 days | Status: CP
Start: 2018-09-25 — End: 2018-12-17
  Filled 2018-09-25: qty 60, 30d supply, fill #0

## 2018-09-25 MED FILL — ELIQUIS 5 MG TABLET: 30 days supply | Qty: 60 | Fill #0 | Status: AC

## 2018-10-09 ENCOUNTER — Ambulatory Visit: Admit: 2018-10-09 | Discharge: 2018-10-10 | Payer: MEDICARE

## 2018-10-09 DIAGNOSIS — R569 Unspecified convulsions: Principal | ICD-10-CM

## 2018-10-09 DIAGNOSIS — G40219 Localization-related (focal) (partial) symptomatic epilepsy and epileptic syndromes with complex partial seizures, intractable, without status epilepticus: Secondary | ICD-10-CM

## 2018-10-09 MED ORDER — CARBAMAZEPINE 200 MG TABLET
ORAL_TABLET | 5 refills | 0 days | Status: CP
Start: 2018-10-09 — End: ?
  Filled 2018-10-20: qty 120, 33d supply, fill #0

## 2018-10-12 MED ORDER — LEVETIRACETAM 500 MG TABLET
ORAL_TABLET | 5 refills | 0 days
Start: 2018-10-12 — End: 2019-03-03

## 2018-10-20 MED FILL — GABAPENTIN 300 MG CAPSULE: 30 days supply | Qty: 60 | Fill #0

## 2018-10-20 MED FILL — METOPROLOL TARTRATE 100 MG TABLET: 90 days supply | Qty: 180 | Fill #0 | Status: AC

## 2018-10-20 MED FILL — TRAZODONE 100 MG TABLET: 90 days supply | Qty: 90 | Fill #0

## 2018-10-20 MED FILL — METOPROLOL TARTRATE 100 MG TABLET: 90 days supply | Qty: 180 | Fill #0

## 2018-10-20 MED FILL — ATORVASTATIN 40 MG TABLET: 90 days supply | Qty: 90 | Fill #0

## 2018-10-20 MED FILL — LISINOPRIL 20 MG-HYDROCHLOROTHIAZIDE 25 MG TABLET: 90 days supply | Qty: 90 | Fill #0

## 2018-10-20 MED FILL — CARBAMAZEPINE 200 MG TABLET: 33 days supply | Qty: 120 | Fill #0 | Status: AC

## 2018-10-20 MED FILL — GABAPENTIN 300 MG CAPSULE: 30 days supply | Qty: 60 | Fill #0 | Status: AC

## 2018-10-20 MED FILL — TRAZODONE 100 MG TABLET: 90 days supply | Qty: 90 | Fill #0 | Status: AC

## 2018-10-20 MED FILL — LISINOPRIL 20 MG-HYDROCHLOROTHIAZIDE 25 MG TABLET: 90 days supply | Qty: 90 | Fill #0 | Status: AC

## 2018-10-20 MED FILL — ATORVASTATIN 40 MG TABLET: 90 days supply | Qty: 90 | Fill #0 | Status: AC

## 2018-10-20 MED FILL — LEVETIRACETAM 500 MG TABLET: 30 days supply | Qty: 60 | Fill #0 | Status: AC

## 2018-10-20 MED FILL — LEVETIRACETAM 500 MG TABLET: 30 days supply | Qty: 60 | Fill #0

## 2018-11-03 MED ORDER — ELIQUIS 5 MG TABLET
ORAL_TABLET | Freq: Two times a day (BID) | ORAL | 6 refills | 0.00000 days
Start: 2018-11-03 — End: ?

## 2018-11-05 MED FILL — ELIQUIS 5 MG TABLET: ORAL | 30 days supply | Qty: 60 | Fill #0

## 2018-11-05 MED FILL — ELIQUIS 5 MG TABLET: 30 days supply | Qty: 60 | Fill #0 | Status: AC

## 2018-12-17 ENCOUNTER — Ambulatory Visit: Admit: 2018-12-17 | Discharge: 2018-12-18

## 2018-12-17 DIAGNOSIS — A419 Sepsis, unspecified organism: Principal | ICD-10-CM

## 2018-12-17 DIAGNOSIS — G40219 Localization-related (focal) (partial) symptomatic epilepsy and epileptic syndromes with complex partial seizures, intractable, without status epilepticus: Principal | ICD-10-CM

## 2018-12-17 DIAGNOSIS — I639 Cerebral infarction, unspecified: Principal | ICD-10-CM

## 2018-12-17 DIAGNOSIS — E785 Hyperlipidemia, unspecified: Principal | ICD-10-CM

## 2018-12-17 DIAGNOSIS — I10 Essential (primary) hypertension: Principal | ICD-10-CM

## 2018-12-17 DIAGNOSIS — I4819 Other persistent atrial fibrillation: Principal | ICD-10-CM

## 2018-12-17 DIAGNOSIS — E782 Mixed hyperlipidemia: Principal | ICD-10-CM

## 2018-12-17 DIAGNOSIS — N179 Acute kidney failure, unspecified: Principal | ICD-10-CM

## 2018-12-17 DIAGNOSIS — K603 Anal fistula: Principal | ICD-10-CM

## 2018-12-17 DIAGNOSIS — N182 Chronic kidney disease, stage 2 (mild): Principal | ICD-10-CM

## 2018-12-17 DIAGNOSIS — J449 Chronic obstructive pulmonary disease, unspecified: Principal | ICD-10-CM

## 2018-12-17 DIAGNOSIS — R569 Unspecified convulsions: Principal | ICD-10-CM

## 2018-12-17 DIAGNOSIS — I4891 Unspecified atrial fibrillation: Principal | ICD-10-CM

## 2018-12-17 DIAGNOSIS — F17200 Nicotine dependence, unspecified, uncomplicated: Principal | ICD-10-CM

## 2018-12-17 MED ORDER — ALBUTEROL SULFATE HFA 90 MCG/ACTUATION AEROSOL INHALER
Freq: Four times a day (QID) | RESPIRATORY_TRACT | 5 refills | 0 days | Status: CP | PRN
Start: 2018-12-17 — End: 2019-12-17

## 2018-12-31 MED FILL — LEVETIRACETAM 500 MG TABLET: 30 days supply | Qty: 60 | Fill #1

## 2018-12-31 MED FILL — METOPROLOL TARTRATE 100 MG TABLET: 90 days supply | Qty: 180 | Fill #1

## 2018-12-31 MED FILL — ATORVASTATIN 40 MG TABLET: 90 days supply | Qty: 90 | Fill #1

## 2018-12-31 MED FILL — CARBAMAZEPINE 200 MG TABLET: 33 days supply | Qty: 120 | Fill #1 | Status: AC

## 2018-12-31 MED FILL — WIXELA INHUB 250 MCG-50 MCG/DOSE POWDER FOR INHALATION: RESPIRATORY_TRACT | 30 days supply | Qty: 60 | Fill #0

## 2018-12-31 MED FILL — CARBAMAZEPINE 200 MG TABLET: 33 days supply | Qty: 120 | Fill #1

## 2018-12-31 MED FILL — WIXELA INHUB 250 MCG-50 MCG/DOSE POWDER FOR INHALATION: 30 days supply | Qty: 60 | Fill #0 | Status: AC

## 2018-12-31 MED FILL — ATORVASTATIN 40 MG TABLET: 90 days supply | Qty: 90 | Fill #1 | Status: AC

## 2018-12-31 MED FILL — METOPROLOL TARTRATE 100 MG TABLET: 90 days supply | Qty: 180 | Fill #1 | Status: AC

## 2018-12-31 MED FILL — LEVETIRACETAM 500 MG TABLET: 30 days supply | Qty: 60 | Fill #1 | Status: AC

## 2019-01-04 MED FILL — ELIQUIS 5 MG TABLET: 30 days supply | Qty: 60 | Fill #1 | Status: AC

## 2019-01-04 MED FILL — ELIQUIS 5 MG TABLET: ORAL | 30 days supply | Qty: 60 | Fill #1

## 2019-01-07 ENCOUNTER — Ambulatory Visit
Admit: 2019-01-07 | Discharge: 2019-01-08 | Payer: MEDICARE | Attending: Colon & Rectal Surgery | Primary: Colon & Rectal Surgery

## 2019-01-07 DIAGNOSIS — K603 Anal fistula: Principal | ICD-10-CM

## 2019-01-07 MED ORDER — METRONIDAZOLE 500 MG TABLET
ORAL_TABLET | Freq: Three times a day (TID) | ORAL | 0 refills | 0 days | Status: CP
Start: 2019-01-07 — End: 2019-01-21
  Filled 2019-01-07: qty 42, 14d supply, fill #0

## 2019-01-07 MED ORDER — CIPROFLOXACIN 500 MG TABLET
ORAL_TABLET | Freq: Two times a day (BID) | ORAL | 0 refills | 0 days | Status: CP
Start: 2019-01-07 — End: 2019-01-21
  Filled 2019-01-07: qty 28, 14d supply, fill #0

## 2019-01-07 MED FILL — CIPROFLOXACIN 500 MG TABLET: 14 days supply | Qty: 28 | Fill #0 | Status: AC

## 2019-01-07 MED FILL — METRONIDAZOLE 500 MG TABLET: 14 days supply | Qty: 42 | Fill #0 | Status: AC

## 2019-02-02 ENCOUNTER — Ambulatory Visit: Admit: 2019-02-02 | Discharge: 2019-02-03 | Payer: MEDICARE

## 2019-02-02 DIAGNOSIS — K603 Anal fistula: Principal | ICD-10-CM

## 2019-02-15 MED ORDER — GABAPENTIN 600 MG TABLET
ORAL_TABLET | Freq: Every day | ORAL | 3 refills | 0.00000 days
Start: 2019-02-15 — End: ?

## 2019-02-16 MED FILL — ELIQUIS 5 MG TABLET: ORAL | 30 days supply | Qty: 60 | Fill #2

## 2019-02-16 MED FILL — GABAPENTIN 600 MG TABLET: 30 days supply | Qty: 30 | Fill #0 | Status: AC

## 2019-02-16 MED FILL — ELIQUIS 5 MG TABLET: 30 days supply | Qty: 60 | Fill #2 | Status: AC

## 2019-02-16 MED FILL — GABAPENTIN 600 MG TABLET: ORAL | 30 days supply | Qty: 30 | Fill #0

## 2019-03-03 ENCOUNTER — Ambulatory Visit: Admit: 2019-03-03 | Discharge: 2019-03-03 | Payer: MEDICARE

## 2019-03-03 DIAGNOSIS — E782 Mixed hyperlipidemia: Secondary | ICD-10-CM

## 2019-03-03 DIAGNOSIS — I4819 Other persistent atrial fibrillation: Secondary | ICD-10-CM

## 2019-03-03 DIAGNOSIS — Z23 Encounter for immunization: Secondary | ICD-10-CM

## 2019-03-03 DIAGNOSIS — J449 Chronic obstructive pulmonary disease, unspecified: Secondary | ICD-10-CM

## 2019-03-03 DIAGNOSIS — N182 Chronic kidney disease, stage 2 (mild): Secondary | ICD-10-CM

## 2019-03-03 DIAGNOSIS — I639 Cerebral infarction, unspecified: Secondary | ICD-10-CM

## 2019-03-03 DIAGNOSIS — Z1159 Encounter for screening for other viral diseases: Secondary | ICD-10-CM

## 2019-03-03 DIAGNOSIS — I10 Essential (primary) hypertension: Principal | ICD-10-CM

## 2019-03-03 DIAGNOSIS — G40219 Localization-related (focal) (partial) symptomatic epilepsy and epileptic syndromes with complex partial seizures, intractable, without status epilepticus: Secondary | ICD-10-CM

## 2019-03-03 DIAGNOSIS — K603 Anal fistula: Secondary | ICD-10-CM

## 2019-03-03 DIAGNOSIS — F17200 Nicotine dependence, unspecified, uncomplicated: Secondary | ICD-10-CM

## 2019-04-06 MED ORDER — TRAZODONE 100 MG TABLET
ORAL_TABLET | Freq: Every day | ORAL | 3 refills | 0.00000 days
Start: 2019-04-06 — End: ?

## 2019-04-07 MED FILL — CARBAMAZEPINE 200 MG TABLET: 30 days supply | Qty: 120 | Fill #2 | Status: AC

## 2019-04-07 MED FILL — CARBAMAZEPINE 200 MG TABLET: 30 days supply | Qty: 120 | Fill #2

## 2019-04-07 MED FILL — ELIQUIS 5 MG TABLET: ORAL | 30 days supply | Qty: 60 | Fill #3

## 2019-04-07 MED FILL — TRAZODONE 100 MG TABLET: ORAL | 90 days supply | Qty: 90 | Fill #0

## 2019-04-07 MED FILL — GABAPENTIN 600 MG TABLET: ORAL | 30 days supply | Qty: 30 | Fill #1

## 2019-04-07 MED FILL — TRAZODONE 100 MG TABLET: 90 days supply | Qty: 90 | Fill #0 | Status: AC

## 2019-04-07 MED FILL — ELIQUIS 5 MG TABLET: 30 days supply | Qty: 60 | Fill #3 | Status: AC

## 2019-04-07 MED FILL — GABAPENTIN 600 MG TABLET: 30 days supply | Qty: 30 | Fill #1 | Status: AC

## 2019-04-19 IMAGING — CR DG CHEST 2V
3 series · 3 of 3 positions shown · non-contrast
Comparison: None.

CLINICAL DATA: Acute confusion.

EXAM:
CHEST - 2 VIEW

[chest lat]
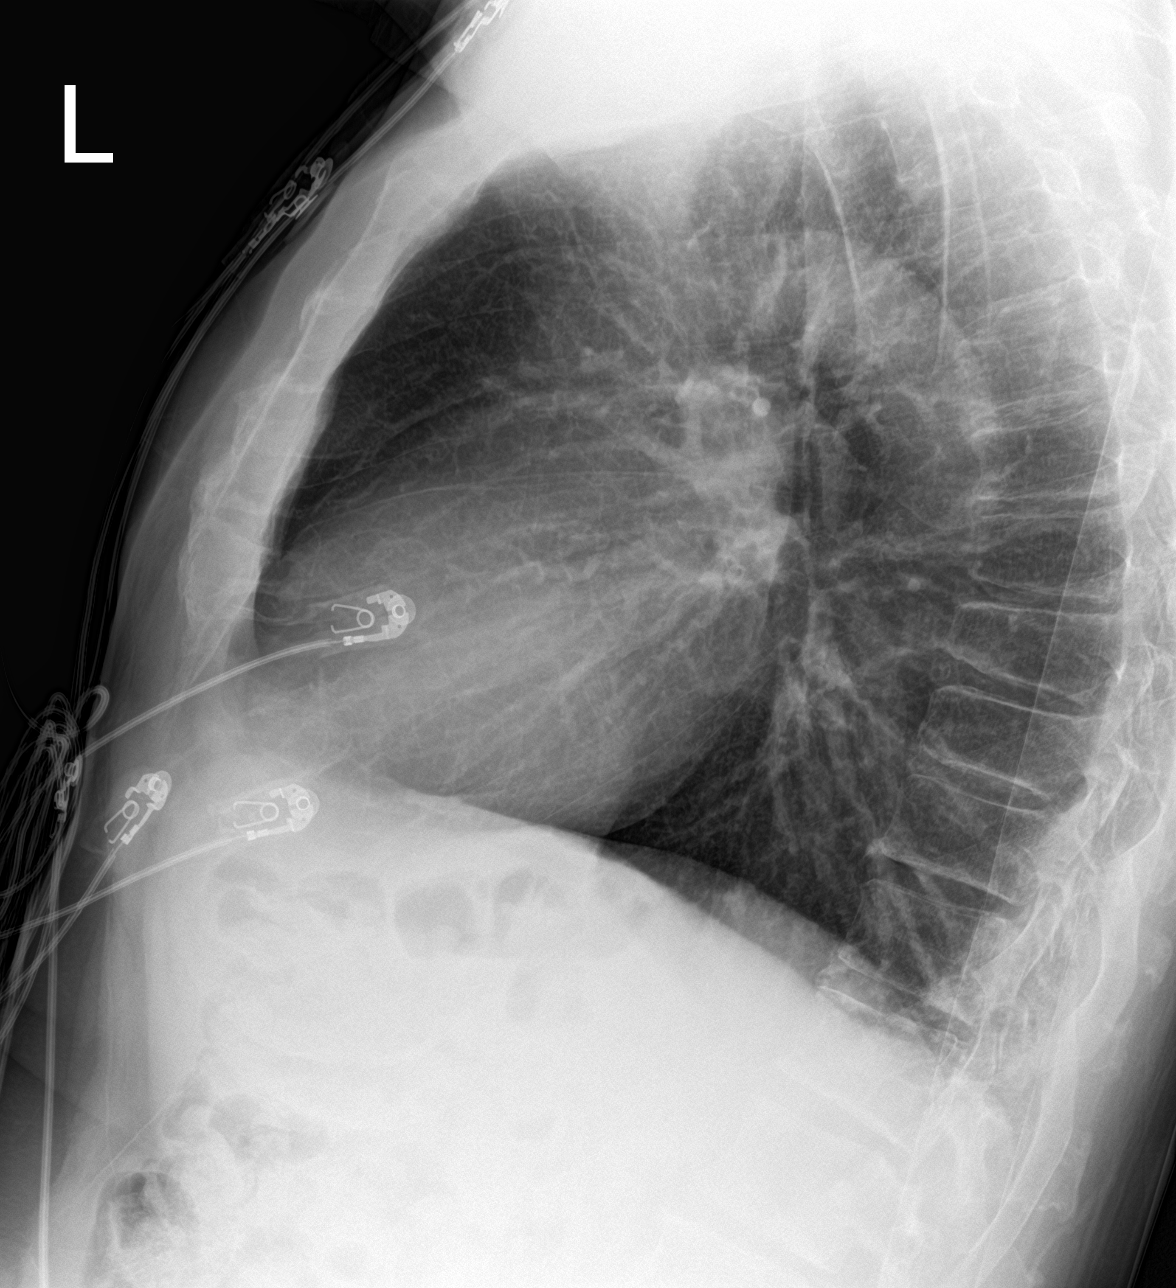

[chest ap (1 of 2)]
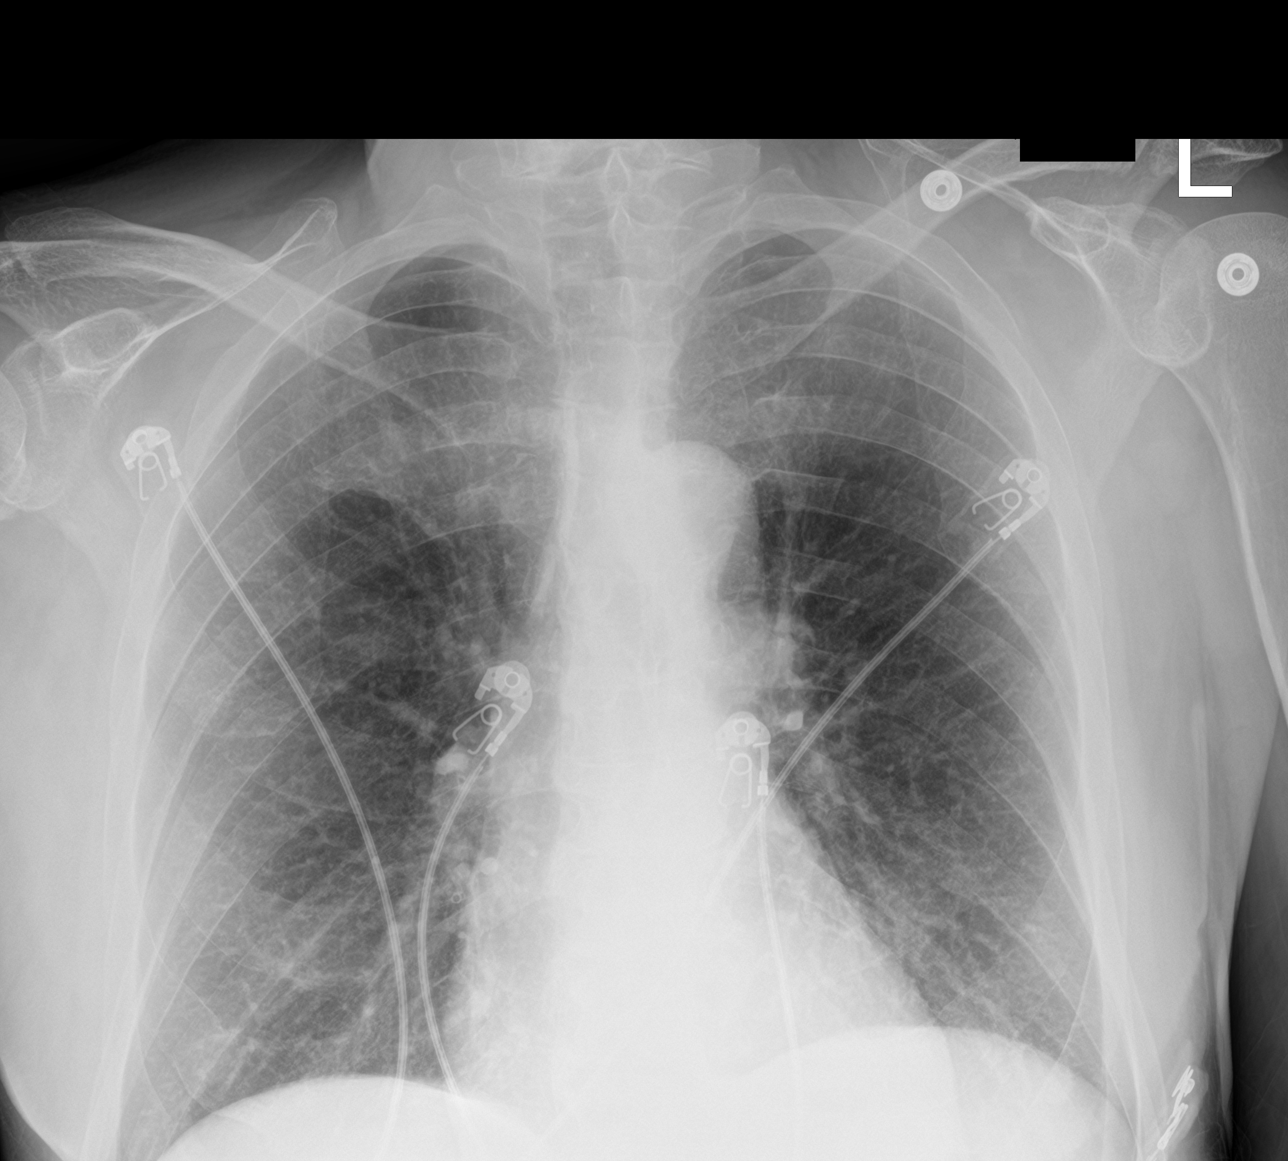

[chest ap (2 of 2)]
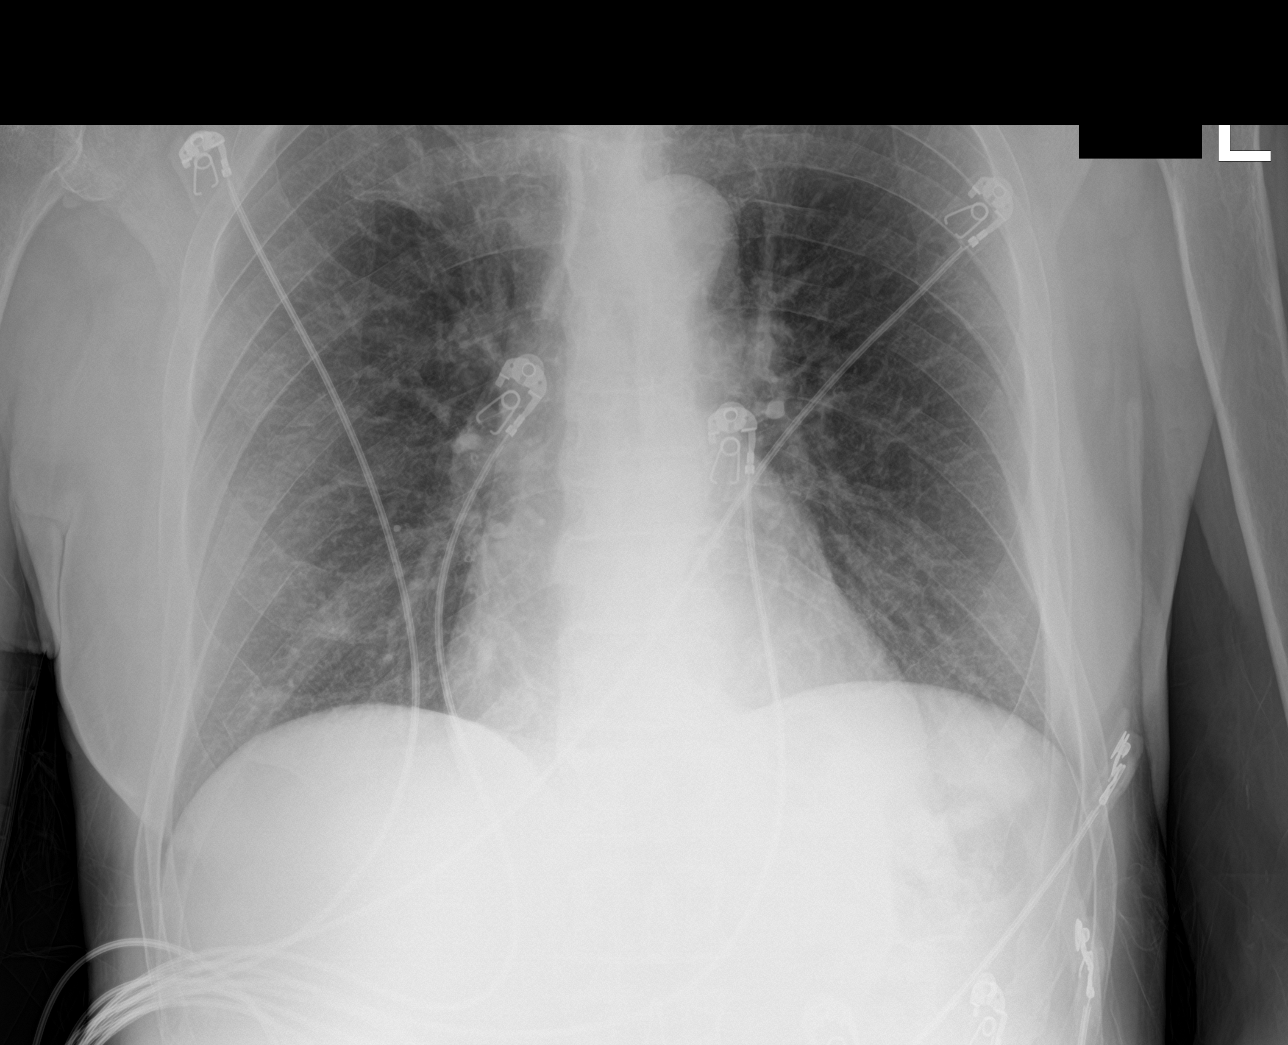

[3 of 3 positions shown; findings below may reference images not displayed]

FINDINGS: The cardiomediastinal silhouette is unremarkable.

Mild peribronchial thickening noted..

There is no evidence of focal airspace disease, pulmonary edema,
suspicious pulmonary nodule/mass, pleural effusion, or pneumothorax.

No acute bony abnormalities are identified.
IMPRESSION: No active cardiopulmonary disease.

## 2019-04-19 IMAGING — CT CT ANGIO NECK
2 of 8 series · 8 of 33 positions shown · IV contrast (OMNI 350)
Comparison: None.

CLINICAL DATA: Near syncope, right arm trembling. History of
hypertension, hypercholesterolemia, atrial fibrillation and stroke.

EXAM:
CT ANGIOGRAPHY HEAD AND NECK
TECHNIQUE: Multidetector CT imaging of the head and neck was performed using
the standard protocol during bolus administration of intravenous
contrast. Multiplanar CT image reconstructions and MIPs were
obtained to evaluate the vascular anatomy. Carotid stenosis
measurements (when applicable) are obtained utilizing NASCET
criteria, using the distal internal carotid diameter as the
denominator.
CONTRAST:  50mL PO13LV-P5M IOPAMIDOL (PO13LV-P5M) INJECTION 76%

[Series 5: cta neck · axial · 0.44mm/px · z∈[-206,-78]mm · 2 of 194 slices shown]
[im 65/194  soft-tissue]
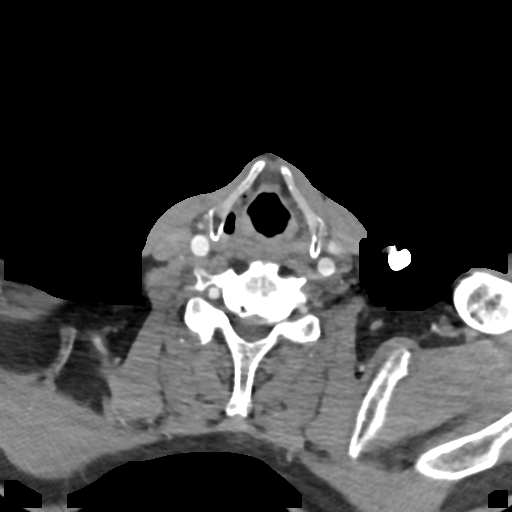
[im 129/194  soft-tissue]
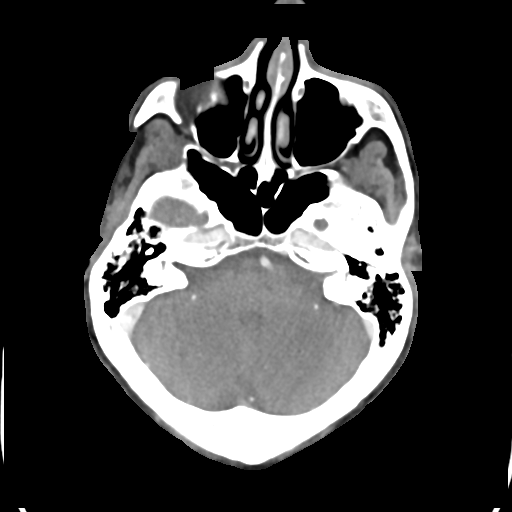

[Series 7: cta neck axial · axial · 0.39mm/px · z∈[-278,-2]mm · 6 of 388 slices shown]
[im 56/388  soft-tissue]
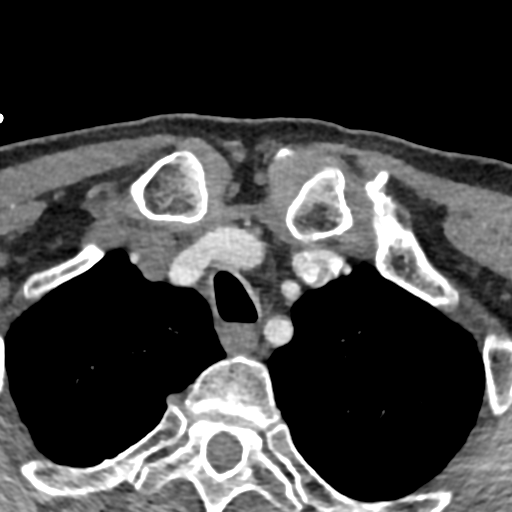
[im 111/388  bone]
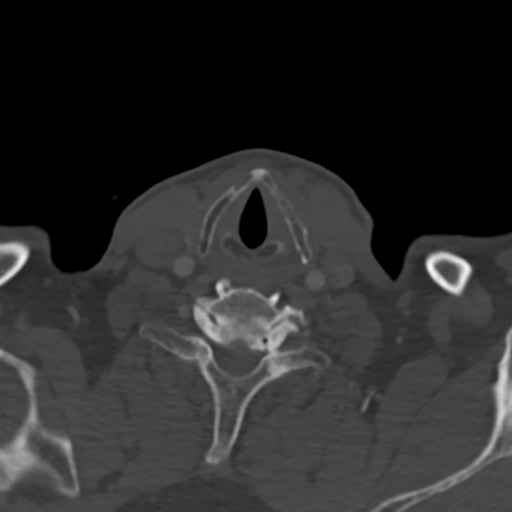
[im 166/388  soft-tissue]
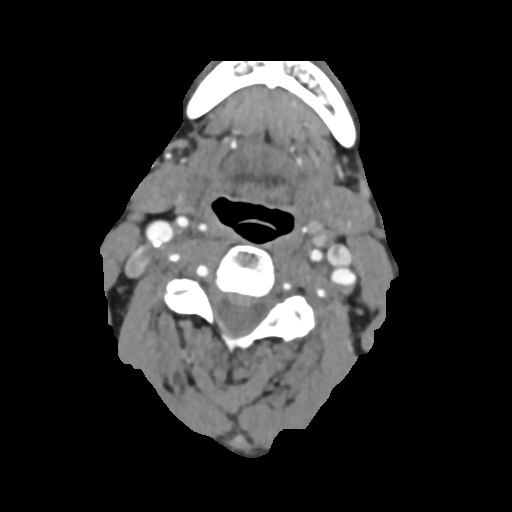
[im 222/388  bone]
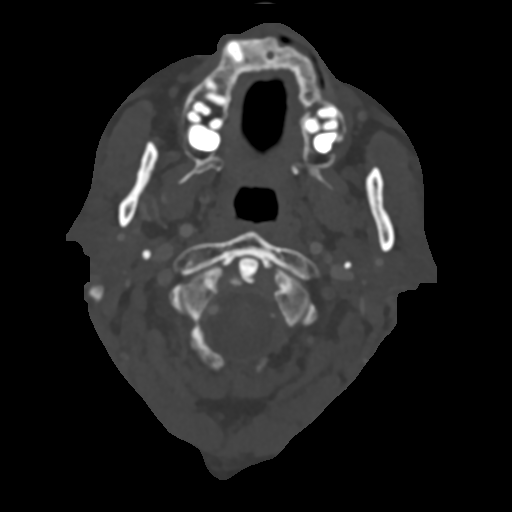
[im 277/388  soft-tissue]
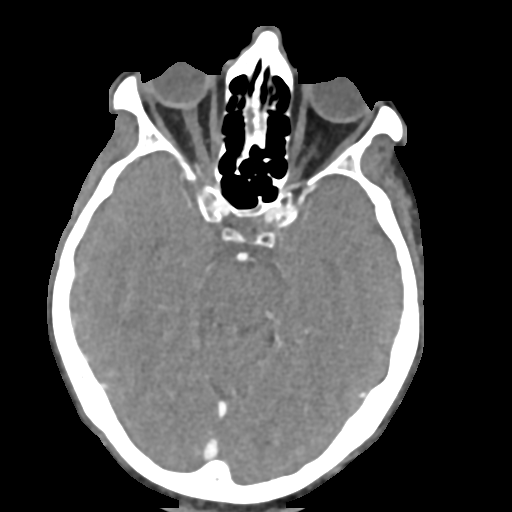
[im 332/388  bone]
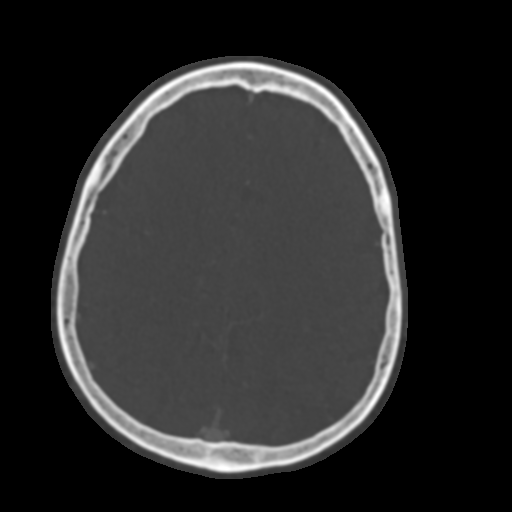

[8 of 33 positions shown; findings below may reference images not displayed]

FINDINGS: CT HEAD FINDINGS

BRAIN: No intraparenchymal hemorrhage, mass effect nor midline
shift. Old small right cerebellar infarct. Left parietotemporal
encephalomalacia. Mild ex vacuo dilatation left lateral ventricle.
No hydrocephalus. Patchy supratentorial white matter FLAIR T2
hyperintensities compatible mild chronic small vessel ischemic
changes. No acute large vascular territory infarcts. No abnormal
extra-axial fluid collections. Basal cisterns are patent.

VASCULAR: Mild calcific atherosclerosis of the carotid siphons.

SKULL: No skull fracture. No significant scalp soft tissue swelling.

SINUSES/ORBITS: The mastoid air-cells and included paranasal sinuses
are well-aerated.The included ocular globes and orbital contents are
non-suspicious.

OTHER: None.

CTA NECK FINDINGS:

AORTIC ARCH: Normal appearance of the thoracic arch, normal branch
pattern. Mild calcific atherosclerosis. The origins of the
innominate, left Common carotid artery and subclavian artery are
widely patent.

RIGHT CAROTID SYSTEM: Common carotid artery is patent. Moderate
calcific atherosclerosis of the carotid bifurcation without
hemodynamically significant stenosis by NASCET criteria. Normal
appearance of the internal carotid artery.

LEFT CAROTID SYSTEM: Common carotid artery is patent. Ectatic left
carotid bifurcation with intimal thickening to lesser extent
calcific atherosclerosis, no hemodynamically significant stenosis by
NASCET criteria. Normal appearance of the internal carotid artery.

VERTEBRAL ARTERIES:Right vertebral artery is dominant. Patent
vertebral arteries, mild left luminal irregularity compatible with
atherosclerosis.

SKELETON: No acute osseous process though bone windows have not been
submitted. Degenerative cervical spine resulting in severe left C5-6
and bilateral C6-7 neural foraminal narrowing.

OTHER NECK: Soft tissues of the neck are nonacute though, not
tailored for evaluation.

UPPER CHEST: Moderate centrilobular emphysema.

CTA HEAD FINDINGS:

ANTERIOR CIRCULATION: Patent cervical internal carotid arteries,
petrous, cavernous and supra clinoid internal carotid arteries.
Patent anterior communicating artery. Patent anterior and middle
cerebral arteries, mild-to-moderate luminal irregularity compatible
with atherosclerosis.

No large vessel occlusion, significant stenosis, contrast
extravasation or aneurysm.

POSTERIOR CIRCULATION: Patent vertebral arteries, vertebrobasilar
junction and basilar artery, as well as main branch vessels. Left
vertebral artery terminates in a posterior inferior cerebellar
artery. Patent posterior cerebral arteries. Mild-to-moderate luminal
irregularity posterior cerebral arteries compatible with
atherosclerosis. Robust right posterior communicating artery
present.

No large vessel occlusion, significant stenosis, contrast
extravasation or aneurysm.

VENOUS SINUSES: Major dural venous sinuses are patent though not
tailored for evaluation on this angiographic examination.

ANATOMIC VARIANTS: None.

DELAYED PHASE: No abnormal intracranial enhancement.

MIP images reviewed.
IMPRESSION: CT head:

1. No acute intracranial process.
2. Old left temporoparietal/MCA territory infarct. Old small right
cerebellar infarct.

CTA neck:

1. No hemodynamically significant stenosis or acute vascular
process.
2. Severe C5-6 and C6-7 neural foraminal narrowing.

CTA head:

1. No emergent large vessel occlusion or flow-limiting stenosis.
2. Mild-to-moderate atherosclerosis.

Aortic Atherosclerosis (4FQDW-PC6.6). Emphysema (4FQDW-SOH.N).

## 2019-04-21 ENCOUNTER — Ambulatory Visit: Admit: 2019-04-21 | Discharge: 2019-04-22 | Payer: MEDICARE

## 2019-04-21 DIAGNOSIS — K603 Anal fistula: Principal | ICD-10-CM

## 2019-04-23 MED ORDER — LISINOPRIL 20 MG-HYDROCHLOROTHIAZIDE 25 MG TABLET
ORAL_TABLET | 3 refills | 0 days | Status: CP
Start: 2019-04-23 — End: ?
  Filled 2019-04-26: qty 90, 90d supply, fill #0

## 2019-04-26 MED FILL — LISINOPRIL 20 MG-HYDROCHLOROTHIAZIDE 25 MG TABLET: 90 days supply | Qty: 90 | Fill #0 | Status: AC

## 2019-06-09 ENCOUNTER — Ambulatory Visit: Admit: 2019-06-09 | Discharge: 2019-06-10 | Payer: MEDICAID

## 2019-06-09 DIAGNOSIS — Z23 Encounter for immunization: Secondary | ICD-10-CM

## 2019-06-09 DIAGNOSIS — K611 Rectal abscess: Secondary | ICD-10-CM

## 2019-06-09 MED ORDER — CIPROFLOXACIN 500 MG TABLET
ORAL_TABLET | Freq: Two times a day (BID) | ORAL | 0 refills | 14.00000 days | Status: CP
Start: 2019-06-09 — End: 2019-06-24
  Filled 2019-06-10: qty 28, 14d supply, fill #0

## 2019-06-09 MED ORDER — METRONIDAZOLE 500 MG TABLET
ORAL_TABLET | Freq: Three times a day (TID) | ORAL | 0 refills | 14.00000 days | Status: CP
Start: 2019-06-09 — End: 2019-06-24
  Filled 2019-06-10: qty 42, 14d supply, fill #0

## 2019-06-09 MED FILL — GABAPENTIN 600 MG TABLET: 30 days supply | Qty: 30 | Fill #2 | Status: AC

## 2019-06-09 MED FILL — ELIQUIS 5 MG TABLET: 30 days supply | Qty: 60 | Fill #4 | Status: AC

## 2019-06-09 MED FILL — GABAPENTIN 600 MG TABLET: ORAL | 30 days supply | Qty: 30 | Fill #2

## 2019-06-09 MED FILL — ELIQUIS 5 MG TABLET: ORAL | 30 days supply | Qty: 60 | Fill #4

## 2019-06-10 MED FILL — CIPROFLOXACIN 500 MG TABLET: 14 days supply | Qty: 28 | Fill #0 | Status: AC

## 2019-06-10 MED FILL — METRONIDAZOLE 500 MG TABLET: 14 days supply | Qty: 42 | Fill #0 | Status: AC

## 2019-06-15 ENCOUNTER — Ambulatory Visit: Admit: 2019-06-15 | Discharge: 2019-06-16 | Payer: MEDICAID

## 2019-06-15 DIAGNOSIS — K603 Anal fistula: Secondary | ICD-10-CM

## 2019-07-09 MED ORDER — METOPROLOL TARTRATE 100 MG TABLET
ORAL_TABLET | Freq: Two times a day (BID) | ORAL | 3 refills | 90.00000 days | Status: CP
Start: 2019-07-09 — End: ?
  Filled 2019-07-13: qty 180, 90d supply, fill #0

## 2019-07-13 MED FILL — METOPROLOL TARTRATE 100 MG TABLET: 90 days supply | Qty: 180 | Fill #0 | Status: AC

## 2019-07-21 DIAGNOSIS — K611 Rectal abscess: Principal | ICD-10-CM

## 2019-08-06 MED ORDER — ATORVASTATIN 40 MG TABLET
ORAL_TABLET | Freq: Every evening | ORAL | 3 refills | 90 days | Status: CP
Start: 2019-08-06 — End: ?
  Filled 2019-08-06: qty 90, 90d supply, fill #0

## 2019-08-06 MED FILL — ATORVASTATIN 40 MG TABLET: 90 days supply | Qty: 90 | Fill #0 | Status: AC

## 2019-08-09 MED FILL — ELIQUIS 5 MG TABLET: ORAL | 30 days supply | Qty: 60 | Fill #5

## 2019-08-09 MED FILL — ELIQUIS 5 MG TABLET: 30 days supply | Qty: 60 | Fill #5 | Status: AC

## 2019-08-17 ENCOUNTER — Ambulatory Visit: Admit: 2019-08-17 | Discharge: 2019-08-18 | Payer: MEDICARE

## 2019-08-17 DIAGNOSIS — K603 Anal fistula: Principal | ICD-10-CM

## 2019-08-17 MED ORDER — CIPROFLOXACIN 500 MG TABLET
ORAL_TABLET | Freq: Two times a day (BID) | ORAL | 0 refills | 14 days | Status: CP
Start: 2019-08-17 — End: 2019-08-31
  Filled 2019-08-17: qty 28, 14d supply, fill #0

## 2019-08-17 MED ORDER — METRONIDAZOLE 500 MG TABLET
ORAL_TABLET | Freq: Three times a day (TID) | ORAL | 0 refills | 14 days | Status: CP
Start: 2019-08-17 — End: 2019-08-31
  Filled 2019-08-17: qty 42, 14d supply, fill #0

## 2019-08-17 MED FILL — METRONIDAZOLE 500 MG TABLET: 14 days supply | Qty: 42 | Fill #0 | Status: AC

## 2019-08-17 MED FILL — CIPROFLOXACIN 500 MG TABLET: 14 days supply | Qty: 28 | Fill #0 | Status: AC

## 2019-08-19 MED FILL — GABAPENTIN 600 MG TABLET: ORAL | 30 days supply | Qty: 30 | Fill #3

## 2019-08-19 MED FILL — GABAPENTIN 600 MG TABLET: 30 days supply | Qty: 30 | Fill #3 | Status: AC

## 2019-09-14 MED FILL — TRAZODONE 100 MG TABLET: 90 days supply | Qty: 90 | Fill #1 | Status: AC

## 2019-09-14 MED FILL — TRAZODONE 100 MG TABLET: ORAL | 90 days supply | Qty: 90 | Fill #1

## 2019-09-20 MED ORDER — GABAPENTIN 600 MG TABLET
ORAL_TABLET | Freq: Every day | ORAL | 3 refills | 30.00000 days
Start: 2019-09-20 — End: ?

## 2019-09-20 MED FILL — GABAPENTIN 600 MG TABLET: 30 days supply | Qty: 30 | Fill #0 | Status: AC

## 2019-09-20 MED FILL — GABAPENTIN 600 MG TABLET: ORAL | 30 days supply | Qty: 30 | Fill #0

## 2019-10-14 MED FILL — LISINOPRIL 20 MG-HYDROCHLOROTHIAZIDE 25 MG TABLET: 90 days supply | Qty: 90 | Fill #1

## 2019-10-14 MED FILL — LISINOPRIL 20 MG-HYDROCHLOROTHIAZIDE 25 MG TABLET: 90 days supply | Qty: 90 | Fill #1 | Status: AC

## 2019-10-14 MED FILL — ELIQUIS 5 MG TABLET: 30 days supply | Qty: 60 | Fill #6 | Status: AC

## 2019-10-14 MED FILL — ELIQUIS 5 MG TABLET: ORAL | 30 days supply | Qty: 60 | Fill #6

## 2019-11-16 ENCOUNTER — Ambulatory Visit: Admit: 2019-11-16 | Discharge: 2019-11-17 | Payer: MEDICARE

## 2019-12-06 ENCOUNTER — Ambulatory Visit: Admit: 2019-12-06 | Discharge: 2019-12-07 | Payer: MEDICARE

## 2019-12-14 ENCOUNTER — Ambulatory Visit: Admit: 2019-12-14 | Discharge: 2019-12-15 | Payer: MEDICARE

## 2019-12-14 DIAGNOSIS — I4821 Permanent atrial fibrillation: Principal | ICD-10-CM

## 2019-12-14 DIAGNOSIS — G4700 Insomnia, unspecified: Principal | ICD-10-CM

## 2019-12-14 DIAGNOSIS — I10 Essential (primary) hypertension: Principal | ICD-10-CM

## 2019-12-14 DIAGNOSIS — K611 Rectal abscess: Principal | ICD-10-CM

## 2019-12-14 DIAGNOSIS — Z122 Encounter for screening for malignant neoplasm of respiratory organs: Principal | ICD-10-CM

## 2019-12-14 DIAGNOSIS — Z136 Encounter for screening for cardiovascular disorders: Principal | ICD-10-CM

## 2019-12-14 DIAGNOSIS — F17200 Nicotine dependence, unspecified, uncomplicated: Principal | ICD-10-CM

## 2019-12-14 DIAGNOSIS — E782 Mixed hyperlipidemia: Principal | ICD-10-CM

## 2019-12-14 DIAGNOSIS — J449 Chronic obstructive pulmonary disease, unspecified: Principal | ICD-10-CM

## 2019-12-14 DIAGNOSIS — G40219 Localization-related (focal) (partial) symptomatic epilepsy and epileptic syndromes with complex partial seizures, intractable, without status epilepticus: Principal | ICD-10-CM

## 2019-12-14 DIAGNOSIS — I639 Cerebral infarction, unspecified: Principal | ICD-10-CM

## 2019-12-14 MED ORDER — ALBUTEROL SULFATE HFA 90 MCG/ACTUATION AEROSOL INHALER
Freq: Four times a day (QID) | RESPIRATORY_TRACT | 5 refills | 0 days | Status: CP | PRN
Start: 2019-12-14 — End: 2020-12-13

## 2019-12-14 MED ORDER — ASPIRIN 81 MG CHEWABLE TABLET
ORAL_TABLET | Freq: Every day | ORAL | 3 refills | 90 days | Status: CP
Start: 2019-12-14 — End: ?

## 2019-12-14 MED ORDER — APIXABAN 5 MG TABLET
ORAL_TABLET | Freq: Two times a day (BID) | ORAL | 3 refills | 90.00000 days | Status: CP
Start: 2019-12-14 — End: ?
  Filled 2019-12-15: qty 180, 90d supply, fill #0

## 2019-12-14 MED ORDER — ATORVASTATIN 40 MG TABLET
ORAL_TABLET | Freq: Every evening | ORAL | 3 refills | 90.00000 days | Status: CP
Start: 2019-12-14 — End: ?

## 2019-12-14 MED ORDER — CARBAMAZEPINE ER 200 MG CAPSULE,EXTENDED RELEASE MPHASE12HR
ORAL_CAPSULE | Freq: Two times a day (BID) | ORAL | 0 refills | 90.00000 days | Status: CP
Start: 2019-12-14 — End: ?

## 2019-12-14 MED ORDER — GABAPENTIN 600 MG TABLET
ORAL_TABLET | Freq: Two times a day (BID) | ORAL | 3 refills | 90.00000 days | Status: CP
Start: 2019-12-14 — End: ?
  Filled 2019-12-15: qty 180, 90d supply, fill #0

## 2019-12-14 MED ORDER — TRAZODONE 100 MG TABLET
ORAL_TABLET | Freq: Every day | ORAL | 3 refills | 90.00000 days | Status: CP
Start: 2019-12-14 — End: ?
  Filled 2020-02-21: qty 90, 90d supply, fill #0

## 2019-12-14 MED ORDER — ADVAIR DISKUS 250 MCG-50 MCG/DOSE POWDER FOR INHALATION
Freq: Two times a day (BID) | RESPIRATORY_TRACT | 3 refills | 90.00000 days | Status: CP
Start: 2019-12-14 — End: ?

## 2019-12-15 MED FILL — ELIQUIS 5 MG TABLET: 90 days supply | Qty: 180 | Fill #0 | Status: AC

## 2019-12-15 MED FILL — GABAPENTIN 600 MG TABLET: 90 days supply | Qty: 180 | Fill #0 | Status: AC

## 2020-01-17 ENCOUNTER — Ambulatory Visit: Admit: 2020-01-17 | Discharge: 2020-01-18 | Payer: MEDICARE

## 2020-01-25 ENCOUNTER — Ambulatory Visit
Admit: 2020-01-25 | Discharge: 2020-01-26 | Payer: MEDICARE | Attending: Cardiovascular Disease | Primary: Cardiovascular Disease

## 2020-01-25 DIAGNOSIS — I712 Thoracic aortic aneurysm, without rupture: Principal | ICD-10-CM

## 2020-01-25 DIAGNOSIS — F17200 Nicotine dependence, unspecified, uncomplicated: Principal | ICD-10-CM

## 2020-01-25 DIAGNOSIS — R0602 Shortness of breath: Principal | ICD-10-CM

## 2020-01-25 DIAGNOSIS — I639 Cerebral infarction, unspecified: Principal | ICD-10-CM

## 2020-01-25 DIAGNOSIS — I4821 Permanent atrial fibrillation: Principal | ICD-10-CM

## 2020-02-01 ENCOUNTER — Ambulatory Visit: Admit: 2020-02-01 | Discharge: 2020-02-02 | Payer: MEDICARE

## 2020-02-21 MED FILL — LISINOPRIL 20 MG-HYDROCHLOROTHIAZIDE 25 MG TABLET: 90 days supply | Qty: 90 | Fill #2 | Status: AC

## 2020-02-21 MED FILL — TRAZODONE 100 MG TABLET: 90 days supply | Qty: 90 | Fill #0 | Status: AC

## 2020-02-21 MED FILL — LISINOPRIL 20 MG-HYDROCHLOROTHIAZIDE 25 MG TABLET: 90 days supply | Qty: 90 | Fill #2

## 2020-03-06 ENCOUNTER — Ambulatory Visit: Admit: 2020-03-06 | Discharge: 2020-03-07 | Payer: MEDICARE

## 2020-03-06 DIAGNOSIS — G40219 Localization-related (focal) (partial) symptomatic epilepsy and epileptic syndromes with complex partial seizures, intractable, without status epilepticus: Principal | ICD-10-CM

## 2020-03-06 DIAGNOSIS — R799 Abnormal finding of blood chemistry, unspecified: Principal | ICD-10-CM

## 2020-03-06 DIAGNOSIS — I10 Essential (primary) hypertension: Principal | ICD-10-CM

## 2020-03-06 DIAGNOSIS — Z114 Encounter for screening for human immunodeficiency virus [HIV]: Principal | ICD-10-CM

## 2020-03-06 DIAGNOSIS — R449 Unspecified symptoms and signs involving general sensations and perceptions: Principal | ICD-10-CM

## 2020-03-06 MED ORDER — CARBAMAZEPINE ER 200 MG CAPSULE,EXTENDED RELEASE 12HR
ORAL_CAPSULE | 1 refills | 94 days | Status: CP
Start: 2020-03-06 — End: 2021-03-13
  Filled 2020-03-06: qty 340, 89d supply, fill #0

## 2020-03-06 MED FILL — CARBAMAZEPINE ER 200 MG CAPSULE,EXTENDED RELEASE 12HR: 89 days supply | Qty: 340 | Fill #0 | Status: AC

## 2020-03-15 ENCOUNTER — Ambulatory Visit: Admit: 2020-03-15 | Discharge: 2020-03-16 | Payer: MEDICARE

## 2020-03-15 DIAGNOSIS — J449 Chronic obstructive pulmonary disease, unspecified: Principal | ICD-10-CM

## 2020-03-15 DIAGNOSIS — I4821 Permanent atrial fibrillation: Principal | ICD-10-CM

## 2020-03-15 DIAGNOSIS — Z7901 Long term (current) use of anticoagulants: Principal | ICD-10-CM

## 2020-03-15 DIAGNOSIS — K603 Anal fistula: Principal | ICD-10-CM

## 2020-03-15 DIAGNOSIS — K611 Rectal abscess: Principal | ICD-10-CM

## 2020-03-15 DIAGNOSIS — F17200 Nicotine dependence, unspecified, uncomplicated: Principal | ICD-10-CM

## 2020-03-15 MED ORDER — ATORVASTATIN 40 MG TABLET
ORAL_TABLET | Freq: Every evening | ORAL | 3 refills | 90.00000 days | Status: CP
Start: 2020-03-15 — End: ?

## 2020-03-15 MED ORDER — METRONIDAZOLE 500 MG TABLET
ORAL_TABLET | Freq: Two times a day (BID) | ORAL | 0 refills | 10.00000 days | Status: CP
Start: 2020-03-15 — End: 2020-03-25

## 2020-03-15 MED ORDER — CIPROFLOXACIN 500 MG TABLET
ORAL_TABLET | Freq: Two times a day (BID) | ORAL | 0 refills | 10 days | Status: CP
Start: 2020-03-15 — End: 2020-03-25

## 2020-03-15 MED ORDER — ALBUTEROL SULFATE HFA 90 MCG/ACTUATION AEROSOL INHALER
Freq: Four times a day (QID) | RESPIRATORY_TRACT | 5 refills | 0.00000 days | Status: CP | PRN
Start: 2020-03-15 — End: 2021-03-15
  Filled 2020-03-23: qty 18, 25d supply, fill #0

## 2020-03-15 MED ORDER — FLUTICASONE 250 MCG-SALMETEROL 50 MCG/DOSE BLISTR POWDR FOR INHALATION
Freq: Two times a day (BID) | RESPIRATORY_TRACT | 3 refills | 90.00000 days | Status: CP
Start: 2020-03-15 — End: ?

## 2020-03-17 ENCOUNTER — Ambulatory Visit: Admit: 2020-03-17 | Discharge: 2020-03-18 | Payer: MEDICARE | Attending: Surgery | Primary: Surgery

## 2020-03-17 DIAGNOSIS — K611 Rectal abscess: Principal | ICD-10-CM

## 2020-03-17 DIAGNOSIS — K603 Anal fistula: Principal | ICD-10-CM

## 2020-03-17 DIAGNOSIS — R109 Unspecified abdominal pain: Principal | ICD-10-CM

## 2020-03-22 ENCOUNTER — Ambulatory Visit
Admit: 2020-03-22 | Discharge: 2020-03-23 | Payer: MEDICARE | Attending: Cardiovascular Disease | Primary: Cardiovascular Disease

## 2020-03-22 DIAGNOSIS — I4821 Permanent atrial fibrillation: Principal | ICD-10-CM

## 2020-03-22 DIAGNOSIS — I712 Thoracic aortic aneurysm, without rupture: Principal | ICD-10-CM

## 2020-03-22 DIAGNOSIS — I429 Cardiomyopathy, unspecified: Principal | ICD-10-CM

## 2020-03-22 DIAGNOSIS — R0602 Shortness of breath: Principal | ICD-10-CM

## 2020-03-22 DIAGNOSIS — F17200 Nicotine dependence, unspecified, uncomplicated: Principal | ICD-10-CM

## 2020-03-22 MED ORDER — ATORVASTATIN 40 MG TABLET
ORAL_TABLET | Freq: Every evening | ORAL | 3 refills | 90 days | Status: CP
Start: 2020-03-22 — End: ?
  Filled 2020-03-23: qty 90, 90d supply, fill #0

## 2020-03-22 MED ORDER — FLUTICASONE 250 MCG-SALMETEROL 50 MCG/DOSE BLISTR POWDR FOR INHALATION
Freq: Two times a day (BID) | RESPIRATORY_TRACT | 3 refills | 90.00000 days | Status: CP
Start: 2020-03-22 — End: ?

## 2020-03-23 MED FILL — ATORVASTATIN 40 MG TABLET: 90 days supply | Qty: 90 | Fill #0 | Status: AC

## 2020-03-23 MED FILL — ALBUTEROL SULFATE HFA 90 MCG/ACTUATION AEROSOL INHALER: 25 days supply | Qty: 18 | Fill #0 | Status: AC

## 2020-04-13 DIAGNOSIS — K611 Rectal abscess: Principal | ICD-10-CM

## 2020-04-13 DIAGNOSIS — K603 Anal fistula: Principal | ICD-10-CM

## 2020-04-13 DIAGNOSIS — K50919 Crohn's disease, unspecified, with unspecified complications: Principal | ICD-10-CM

## 2020-04-18 ENCOUNTER — Encounter: Admit: 2020-04-18 | Discharge: 2020-04-18 | Payer: MEDICARE

## 2020-04-18 ENCOUNTER — Ambulatory Visit: Admit: 2020-04-18 | Discharge: 2020-04-18 | Payer: MEDICARE

## 2020-04-18 MED ORDER — OXYCODONE 5 MG TABLET
ORAL_TABLET | ORAL | 0 refills | 4.00000 days | Status: CP | PRN
Start: 2020-04-18 — End: 2020-04-25
  Filled 2020-04-18: qty 40, 7d supply, fill #0

## 2020-04-18 MED FILL — OXYCODONE 5 MG TABLET: 7 days supply | Qty: 40 | Fill #0 | Status: AC

## 2020-04-27 DIAGNOSIS — C210 Malignant neoplasm of anus, unspecified: Principal | ICD-10-CM

## 2020-04-28 ENCOUNTER — Ambulatory Visit: Admit: 2020-04-28 | Discharge: 2020-04-29 | Payer: MEDICARE

## 2020-05-09 ENCOUNTER — Ambulatory Visit: Admit: 2020-05-09 | Discharge: 2020-05-10 | Payer: MEDICARE

## 2020-05-11 ENCOUNTER — Ambulatory Visit: Admit: 2020-05-11 | Discharge: 2020-05-12 | Payer: MEDICARE

## 2020-05-15 ENCOUNTER — Ambulatory Visit: Admit: 2020-05-15 | Discharge: 2020-05-30 | Payer: MEDICARE

## 2020-05-15 ENCOUNTER — Ambulatory Visit
Admit: 2020-05-15 | Discharge: 2020-05-30 | Payer: MEDICARE | Attending: Radiation Oncology | Primary: Radiation Oncology

## 2020-05-15 DIAGNOSIS — C210 Malignant neoplasm of anus, unspecified: Principal | ICD-10-CM

## 2020-05-16 DIAGNOSIS — C210 Malignant neoplasm of anus, unspecified: Principal | ICD-10-CM

## 2020-05-17 DIAGNOSIS — C210 Malignant neoplasm of anus, unspecified: Principal | ICD-10-CM

## 2020-05-18 ENCOUNTER — Ambulatory Visit: Admit: 2020-05-18 | Discharge: 2020-05-19 | Payer: MEDICARE | Attending: Surgery | Primary: Surgery

## 2020-05-18 ENCOUNTER — Ambulatory Visit: Admit: 2020-05-18 | Discharge: 2020-05-19 | Payer: MEDICARE | Attending: Gastroenterology | Primary: Gastroenterology

## 2020-05-18 DIAGNOSIS — K611 Rectal abscess: Principal | ICD-10-CM

## 2020-05-18 DIAGNOSIS — C210 Malignant neoplasm of anus, unspecified: Principal | ICD-10-CM

## 2020-05-18 DIAGNOSIS — K603 Anal fistula: Principal | ICD-10-CM

## 2020-05-18 DIAGNOSIS — R109 Unspecified abdominal pain: Principal | ICD-10-CM

## 2020-05-18 MED ORDER — AMOXICILLIN 875 MG-POTASSIUM CLAVULANATE 125 MG TABLET
ORAL_TABLET | Freq: Two times a day (BID) | ORAL | 0 refills | 14.00000 days | Status: CP
Start: 2020-05-18 — End: 2020-06-01

## 2020-05-18 MED ORDER — AMOXICILLIN 875 MG-POTASSIUM CLAVULANATE 125 MG TABLET: 1 | tablet | Freq: Two times a day (BID) | 0 refills | 14 days | Status: AC

## 2020-05-18 MED ORDER — OXYCODONE-ACETAMINOPHEN 5 MG-325 MG TABLET
ORAL_TABLET | Freq: Three times a day (TID) | ORAL | 0 refills | 10.00000 days | Status: CP | PRN
Start: 2020-05-18 — End: ?

## 2020-05-19 ENCOUNTER — Ambulatory Visit
Admit: 2020-05-19 | Discharge: 2020-05-19 | Disposition: A | Discharge status: Home / Self Care | Payer: MEDICARE | Attending: Emergency Medicine

## 2020-05-19 ENCOUNTER — Emergency Department
Admit: 2020-05-19 | Discharge: 2020-05-19 | Disposition: A | Discharge status: Home / Self Care | Payer: MEDICARE | Attending: Emergency Medicine

## 2020-05-19 ENCOUNTER — Ambulatory Visit: Admit: 2020-05-19 | Discharge: 2020-05-20 | Payer: MEDICARE

## 2020-05-19 DIAGNOSIS — R079 Chest pain, unspecified: Principal | ICD-10-CM

## 2020-05-19 DIAGNOSIS — C20 Malignant neoplasm of rectum: Principal | ICD-10-CM

## 2020-05-19 DIAGNOSIS — R0600 Dyspnea, unspecified: Principal | ICD-10-CM

## 2020-05-19 DIAGNOSIS — R0602 Shortness of breath: Principal | ICD-10-CM

## 2020-05-19 DIAGNOSIS — K611 Rectal abscess: Principal | ICD-10-CM

## 2020-05-19 DIAGNOSIS — I4891 Unspecified atrial fibrillation: Principal | ICD-10-CM

## 2020-05-19 DIAGNOSIS — C210 Malignant neoplasm of anus, unspecified: Principal | ICD-10-CM

## 2020-05-19 DIAGNOSIS — I4811 Longstanding persistent atrial fibrillation: Principal | ICD-10-CM

## 2020-05-23 ENCOUNTER — Ambulatory Visit: Admit: 2020-05-23 | Payer: MEDICARE | Attending: Cardiovascular Disease | Primary: Cardiovascular Disease

## 2020-05-23 MED ORDER — SODIUM,POTASSIUM,MAG SULFATES 17.5 GRAM-3.13 GRAM-1.6 GRAM ORAL SOLN
Freq: Once | ORAL | 0 refills | 0 days | Status: CP
Start: 2020-05-23 — End: 2020-05-23

## 2020-05-24 DIAGNOSIS — C210 Malignant neoplasm of anus, unspecified: Principal | ICD-10-CM

## 2020-05-24 MED ORDER — SUPREP BOWEL PREP KIT 17.5 GRAM-3.13 GRAM-1.6 GRAM ORAL SOLUTION
Freq: Once | ORAL | 0 refills | 0 days | Status: CP
Start: 2020-05-24 — End: 2020-05-24

## 2020-05-25 ENCOUNTER — Encounter: Admit: 2020-05-25 | Discharge: 2020-05-25 | Payer: MEDICARE | Attending: Anesthesiology | Primary: Anesthesiology

## 2020-05-25 ENCOUNTER — Ambulatory Visit: Admit: 2020-05-25 | Discharge: 2020-05-25 | Payer: MEDICARE

## 2020-05-29 ENCOUNTER — Ambulatory Visit: Admit: 2020-05-29 | Discharge: 2020-05-29 | Payer: MEDICARE

## 2020-05-29 DIAGNOSIS — C210 Malignant neoplasm of anus, unspecified: Principal | ICD-10-CM

## 2020-05-29 MED ORDER — LIDOCAINE-PRILOCAINE 2.5 %-2.5 % TOPICAL CREAM: g | 3 refills | 0 days | Status: AC

## 2020-05-29 MED ORDER — LORAZEPAM 1 MG TABLET
ORAL_TABLET | Freq: Every evening | ORAL | 2 refills | 30.00000 days | Status: CP | PRN
Start: 2020-05-29 — End: ?

## 2020-05-29 MED ORDER — PROCHLORPERAZINE MALEATE 10 MG TABLET
ORAL_TABLET | Freq: Four times a day (QID) | ORAL | 2 refills | 8 days | Status: CP | PRN
Start: 2020-05-29 — End: ?

## 2020-05-29 MED ORDER — LOPERAMIDE 2 MG CAPSULE
ORAL_CAPSULE | 2 refills | 0 days | Status: CP
Start: 2020-05-29 — End: ?

## 2020-05-29 MED ORDER — LIDOCAINE-PRILOCAINE 2.5 %-2.5 % TOPICAL CREAM
3 refills | 0.00000 days | Status: CP
Start: 2020-05-29 — End: 2020-05-29

## 2020-05-29 MED ORDER — SODIUM CHLORIDE 0.9 % (FLUSH) INJECTION SYRINGE
INTRAVENOUS | PRN refills | 0.00000 days
Start: 2020-05-29 — End: ?

## 2020-05-29 MED ORDER — HEPARIN, PORCINE (PF) 100 UNIT/ML INTRAVENOUS SYRINGE
INTRAVENOUS | PRN refills | 0 days
Start: 2020-05-29 — End: ?

## 2020-05-30 ENCOUNTER — Ambulatory Visit: Admit: 2020-05-30 | Discharge: 2020-05-30 | Payer: MEDICARE

## 2020-05-30 DIAGNOSIS — C210 Malignant neoplasm of anus, unspecified: Principal | ICD-10-CM

## 2020-05-30 MED ORDER — PROCHLORPERAZINE MALEATE 10 MG TABLET
ORAL_TABLET | Freq: Four times a day (QID) | ORAL | 2 refills | 8 days | Status: CP | PRN
Start: 2020-05-30 — End: ?

## 2020-05-30 MED ORDER — LORAZEPAM 1 MG TABLET
ORAL_TABLET | Freq: Every evening | ORAL | 0 refills | 30.00000 days | Status: CP | PRN
Start: 2020-05-30 — End: 2020-05-30

## 2020-05-30 MED ORDER — LOPERAMIDE 2 MG CAPSULE
ORAL_CAPSULE | 2 refills | 0 days | Status: CP
Start: 2020-05-30 — End: ?

## 2020-05-30 MED ORDER — LORAZEPAM 1 MG TABLET: 1 mg | tablet | Freq: Every evening | 0 refills | 30 days | Status: AC

## 2020-06-01 ENCOUNTER — Institutional Professional Consult (permissible substitution): Admit: 2020-06-01 | Discharge: 2020-06-02 | Payer: MEDICARE

## 2020-06-01 DIAGNOSIS — C210 Malignant neoplasm of anus, unspecified: Principal | ICD-10-CM

## 2020-06-06 ENCOUNTER — Ambulatory Visit: Admit: 2020-06-06 | Discharge: 2020-06-07 | Payer: MEDICARE

## 2020-06-06 DIAGNOSIS — G40219 Localization-related (focal) (partial) symptomatic epilepsy and epileptic syndromes with complex partial seizures, intractable, without status epilepticus: Principal | ICD-10-CM

## 2020-06-06 MED ORDER — CARBAMAZEPINE ER 200 MG CAPSULE,EXTENDED RELEASE 12HR
ORAL_CAPSULE | 1 refills | 0 days | Status: CP
Start: 2020-06-06 — End: ?

## 2020-06-08 DIAGNOSIS — C210 Malignant neoplasm of anus, unspecified: Principal | ICD-10-CM

## 2020-06-09 ENCOUNTER — Ambulatory Visit: Admit: 2020-06-09 | Discharge: 2020-06-09 | Payer: MEDICARE

## 2020-06-09 DIAGNOSIS — C210 Malignant neoplasm of anus, unspecified: Principal | ICD-10-CM

## 2020-06-09 DIAGNOSIS — F17200 Nicotine dependence, unspecified, uncomplicated: Principal | ICD-10-CM

## 2020-06-12 DIAGNOSIS — C210 Malignant neoplasm of anus, unspecified: Principal | ICD-10-CM

## 2020-06-12 MED FILL — ELIQUIS 5 MG TABLET: ORAL | 90 days supply | Qty: 180 | Fill #1

## 2020-06-12 MED FILL — GABAPENTIN 600 MG TABLET: ORAL | 90 days supply | Qty: 180 | Fill #1

## 2020-06-12 MED FILL — ELIQUIS 5 MG TABLET: 90 days supply | Qty: 180 | Fill #1 | Status: AC

## 2020-06-12 MED FILL — GABAPENTIN 600 MG TABLET: 90 days supply | Qty: 180 | Fill #1 | Status: AC

## 2020-06-13 ENCOUNTER — Ambulatory Visit: Admit: 2020-06-13 | Discharge: 2020-06-13 | Payer: MEDICARE | Attending: Surgery | Primary: Surgery

## 2020-06-13 ENCOUNTER — Ambulatory Visit: Admit: 2020-06-13 | Discharge: 2020-06-13 | Payer: MEDICARE

## 2020-06-13 DIAGNOSIS — Z09 Encounter for follow-up examination after completed treatment for conditions other than malignant neoplasm: Principal | ICD-10-CM

## 2020-06-13 DIAGNOSIS — C210 Malignant neoplasm of anus, unspecified: Principal | ICD-10-CM

## 2020-06-16 ENCOUNTER — Institutional Professional Consult (permissible substitution): Admit: 2020-06-16 | Discharge: 2020-06-17 | Payer: MEDICARE

## 2020-06-16 DIAGNOSIS — C210 Malignant neoplasm of anus, unspecified: Principal | ICD-10-CM

## 2020-06-23 DIAGNOSIS — C20 Malignant neoplasm of rectum: Principal | ICD-10-CM

## 2020-06-26 ENCOUNTER — Ambulatory Visit: Admit: 2020-06-26 | Discharge: 2020-06-27 | Payer: MEDICARE | Attending: Adult Health | Primary: Adult Health

## 2020-06-26 ENCOUNTER — Ambulatory Visit: Admit: 2020-06-26 | Discharge: 2020-06-27 | Payer: MEDICARE

## 2020-06-26 DIAGNOSIS — C20 Malignant neoplasm of rectum: Principal | ICD-10-CM

## 2020-06-26 DIAGNOSIS — B001 Herpesviral vesicular dermatitis: Principal | ICD-10-CM

## 2020-06-26 DIAGNOSIS — C210 Malignant neoplasm of anus, unspecified: Principal | ICD-10-CM

## 2020-06-26 MED ORDER — VALACYCLOVIR 500 MG TABLET
ORAL_TABLET | Freq: Two times a day (BID) | ORAL | 2 refills | 9 days | Status: CP
Start: 2020-06-26 — End: 2020-06-28

## 2020-06-27 ENCOUNTER — Ambulatory Visit: Admit: 2020-06-27 | Discharge: 2020-06-28 | Payer: MEDICARE

## 2020-06-27 DIAGNOSIS — C210 Malignant neoplasm of anus, unspecified: Principal | ICD-10-CM

## 2020-06-29 ENCOUNTER — Institutional Professional Consult (permissible substitution): Admit: 2020-06-29 | Discharge: 2020-06-30 | Payer: MEDICARE

## 2020-06-29 DIAGNOSIS — C210 Malignant neoplasm of anus, unspecified: Principal | ICD-10-CM

## 2020-07-10 ENCOUNTER — Ambulatory Visit: Admit: 2020-07-10 | Discharge: 2020-07-10 | Payer: MEDICARE | Attending: Adult Health | Primary: Adult Health

## 2020-07-10 ENCOUNTER — Ambulatory Visit: Admit: 2020-07-10 | Discharge: 2020-07-10 | Payer: MEDICARE

## 2020-07-10 DIAGNOSIS — C210 Malignant neoplasm of anus, unspecified: Principal | ICD-10-CM

## 2020-07-11 ENCOUNTER — Ambulatory Visit
Admit: 2020-07-11 | Discharge: 2020-07-12 | Payer: MEDICARE | Attending: Cardiovascular Disease | Primary: Cardiovascular Disease

## 2020-07-11 ENCOUNTER — Ambulatory Visit: Admit: 2020-07-11 | Discharge: 2020-07-12 | Payer: MEDICARE

## 2020-07-11 DIAGNOSIS — I429 Cardiomyopathy, unspecified: Principal | ICD-10-CM

## 2020-07-11 DIAGNOSIS — R0602 Shortness of breath: Principal | ICD-10-CM

## 2020-07-11 DIAGNOSIS — C210 Malignant neoplasm of anus, unspecified: Principal | ICD-10-CM

## 2020-07-11 DIAGNOSIS — I4821 Permanent atrial fibrillation: Principal | ICD-10-CM

## 2020-07-11 MED ORDER — FUROSEMIDE 20 MG TABLET
ORAL_TABLET | Freq: Every day | ORAL | 5 refills | 30 days | Status: CP | PRN
Start: 2020-07-11 — End: ?

## 2020-07-13 ENCOUNTER — Institutional Professional Consult (permissible substitution): Admit: 2020-07-13 | Discharge: 2020-07-14 | Payer: MEDICARE

## 2020-07-13 DIAGNOSIS — C210 Malignant neoplasm of anus, unspecified: Principal | ICD-10-CM

## 2020-07-14 ENCOUNTER — Ambulatory Visit: Admit: 2020-07-14 | Discharge: 2020-07-15 | Payer: MEDICARE

## 2020-07-24 ENCOUNTER — Ambulatory Visit: Admit: 2020-07-24 | Discharge: 2020-07-24 | Payer: MEDICARE

## 2020-07-24 DIAGNOSIS — C210 Malignant neoplasm of anus, unspecified: Principal | ICD-10-CM

## 2020-07-26 DIAGNOSIS — C210 Malignant neoplasm of anus, unspecified: Principal | ICD-10-CM

## 2020-07-26 MED ORDER — OXYCODONE 5 MG TABLET
ORAL_TABLET | ORAL | 0 refills | 7 days | Status: CP | PRN
Start: 2020-07-26 — End: ?

## 2020-07-31 ENCOUNTER — Ambulatory Visit: Admit: 2020-07-31 | Discharge: 2020-08-01 | Payer: MEDICARE

## 2020-07-31 DIAGNOSIS — C210 Malignant neoplasm of anus, unspecified: Principal | ICD-10-CM

## 2020-08-01 ENCOUNTER — Ambulatory Visit: Admit: 2020-08-01 | Discharge: 2020-08-01 | Payer: MEDICARE

## 2020-08-01 DIAGNOSIS — Z5111 Encounter for antineoplastic chemotherapy: Principal | ICD-10-CM

## 2020-08-01 DIAGNOSIS — C210 Malignant neoplasm of anus, unspecified: Principal | ICD-10-CM

## 2020-08-02 ENCOUNTER — Ambulatory Visit: Admit: 2020-08-02 | Discharge: 2020-08-03 | Payer: MEDICARE

## 2020-08-03 ENCOUNTER — Institutional Professional Consult (permissible substitution): Admit: 2020-08-03 | Discharge: 2020-08-04 | Payer: MEDICARE

## 2020-08-03 DIAGNOSIS — Z451 Encounter for adjustment and management of infusion pump: Principal | ICD-10-CM

## 2020-08-03 DIAGNOSIS — C210 Malignant neoplasm of anus, unspecified: Principal | ICD-10-CM

## 2020-08-08 MED ORDER — LISINOPRIL 20 MG-HYDROCHLOROTHIAZIDE 25 MG TABLET
ORAL_TABLET | Freq: Every day | ORAL | 3 refills | 90.00000 days | Status: CP
Start: 2020-08-08 — End: ?
  Filled 2020-08-10: qty 90, 90d supply, fill #0

## 2020-08-10 MED FILL — LISINOPRIL 20 MG-HYDROCHLOROTHIAZIDE 25 MG TABLET: 90 days supply | Qty: 90 | Fill #0 | Status: AC

## 2020-08-14 ENCOUNTER — Ambulatory Visit: Admit: 2020-08-14 | Discharge: 2020-08-15 | Payer: MEDICARE

## 2020-08-14 DIAGNOSIS — C20 Malignant neoplasm of rectum: Principal | ICD-10-CM

## 2020-08-14 DIAGNOSIS — C210 Malignant neoplasm of anus, unspecified: Principal | ICD-10-CM

## 2020-08-15 ENCOUNTER — Ambulatory Visit: Admit: 2020-08-15 | Discharge: 2020-08-16 | Payer: MEDICARE

## 2020-08-15 DIAGNOSIS — C210 Malignant neoplasm of anus, unspecified: Principal | ICD-10-CM

## 2020-08-15 DIAGNOSIS — Z5111 Encounter for antineoplastic chemotherapy: Principal | ICD-10-CM

## 2020-08-17 ENCOUNTER — Institutional Professional Consult (permissible substitution): Admit: 2020-08-17 | Discharge: 2020-08-17 | Payer: MEDICARE

## 2020-08-17 DIAGNOSIS — C210 Malignant neoplasm of anus, unspecified: Principal | ICD-10-CM

## 2020-08-21 ENCOUNTER — Ambulatory Visit: Admit: 2020-08-21 | Discharge: 2020-08-22 | Payer: MEDICARE

## 2020-08-26 ENCOUNTER — Ambulatory Visit: Admit: 2020-08-26 | Discharge: 2020-08-27 | Payer: MEDICARE

## 2020-08-28 ENCOUNTER — Ambulatory Visit: Admit: 2020-08-28 | Discharge: 2020-08-28 | Payer: MEDICARE

## 2020-08-28 ENCOUNTER — Ambulatory Visit: Admit: 2020-08-28 | Discharge: 2020-08-28 | Payer: MEDICARE | Attending: Adult Health | Primary: Adult Health

## 2020-08-28 DIAGNOSIS — C210 Malignant neoplasm of anus, unspecified: Principal | ICD-10-CM

## 2020-08-28 MED ORDER — OXYCODONE 5 MG TABLET
ORAL_TABLET | ORAL | 0 refills | 7.00000 days | Status: CP | PRN
Start: 2020-08-28 — End: 2020-10-05

## 2020-08-29 ENCOUNTER — Ambulatory Visit: Admit: 2020-08-29 | Discharge: 2020-08-29 | Payer: MEDICARE

## 2020-08-29 DIAGNOSIS — Z5111 Encounter for antineoplastic chemotherapy: Principal | ICD-10-CM

## 2020-08-29 DIAGNOSIS — C210 Malignant neoplasm of anus, unspecified: Principal | ICD-10-CM

## 2020-08-31 ENCOUNTER — Institutional Professional Consult (permissible substitution): Admit: 2020-08-31 | Discharge: 2020-08-31 | Payer: MEDICARE

## 2020-08-31 DIAGNOSIS — C210 Malignant neoplasm of anus, unspecified: Principal | ICD-10-CM

## 2020-09-08 MED FILL — ATORVASTATIN 40 MG TABLET: ORAL | 90 days supply | Qty: 90 | Fill #1

## 2020-09-08 MED FILL — TRAZODONE 100 MG TABLET: 90 days supply | Qty: 90 | Fill #1 | Status: AC

## 2020-09-08 MED FILL — ATORVASTATIN 40 MG TABLET: 90 days supply | Qty: 90 | Fill #1 | Status: AC

## 2020-09-08 MED FILL — TRAZODONE 100 MG TABLET: ORAL | 90 days supply | Qty: 90 | Fill #1

## 2020-09-11 ENCOUNTER — Ambulatory Visit: Admit: 2020-09-11 | Discharge: 2020-09-12 | Payer: MEDICARE

## 2020-09-11 DIAGNOSIS — I959 Hypotension, unspecified: Principal | ICD-10-CM

## 2020-09-11 DIAGNOSIS — I10 Essential (primary) hypertension: Principal | ICD-10-CM

## 2020-09-11 DIAGNOSIS — C210 Malignant neoplasm of anus, unspecified: Principal | ICD-10-CM

## 2020-09-11 MED ORDER — METRONIDAZOLE 500 MG TABLET
ORAL_TABLET | Freq: Three times a day (TID) | ORAL | 0 refills | 14 days | Status: CP
Start: 2020-09-11 — End: 2020-09-25

## 2020-09-12 ENCOUNTER — Ambulatory Visit: Admit: 2020-09-12 | Discharge: 2020-09-12 | Payer: MEDICARE

## 2020-09-12 DIAGNOSIS — E876 Hypokalemia: Principal | ICD-10-CM

## 2020-09-12 DIAGNOSIS — C210 Malignant neoplasm of anus, unspecified: Principal | ICD-10-CM

## 2020-09-12 DIAGNOSIS — I959 Hypotension, unspecified: Principal | ICD-10-CM

## 2020-09-12 MED ORDER — POTASSIUM CHLORIDE ER 20 MEQ TABLET,EXTENDED RELEASE(PART/CRYST)
ORAL_TABLET | Freq: Two times a day (BID) | ORAL | 0 refills | 3.00000 days | Status: CP
Start: 2020-09-12 — End: 2020-09-15

## 2020-09-13 ENCOUNTER — Ambulatory Visit: Admit: 2020-09-13 | Discharge: 2020-09-13 | Payer: MEDICARE

## 2020-09-13 DIAGNOSIS — C210 Malignant neoplasm of anus, unspecified: Principal | ICD-10-CM

## 2020-09-13 DIAGNOSIS — Z5111 Encounter for antineoplastic chemotherapy: Principal | ICD-10-CM

## 2020-09-15 ENCOUNTER — Institutional Professional Consult (permissible substitution): Admit: 2020-09-15 | Discharge: 2020-09-16 | Payer: MEDICARE

## 2020-09-15 DIAGNOSIS — C210 Malignant neoplasm of anus, unspecified: Principal | ICD-10-CM

## 2020-09-19 DIAGNOSIS — C210 Malignant neoplasm of anus, unspecified: Principal | ICD-10-CM

## 2020-09-20 DIAGNOSIS — C210 Malignant neoplasm of anus, unspecified: Principal | ICD-10-CM

## 2020-09-20 MED ORDER — CAPECITABINE 150 MG TABLET
ORAL_TABLET | Freq: Two times a day (BID) | ORAL | 0 refills | 8.00000 days | Status: CP
Start: 2020-09-20 — End: 2020-09-20
  Filled 2020-10-04: qty 80, 28d supply, fill #0

## 2020-09-20 MED ORDER — ZZ IMS TEMPLATE
Freq: Two times a day (BID) | ORAL | 0 refills | 0.00000 days | Status: CN
Start: 2020-09-20 — End: ?

## 2020-09-20 MED ORDER — CAPECITABINE 500 MG TABLET
ORAL_TABLET | Freq: Two times a day (BID) | ORAL | 0 refills | 45.00000 days | Status: CP
Start: 2020-09-20 — End: 2020-09-20

## 2020-09-24 NOTE — Unmapped (Signed)
[facility] Specialty Medication Onboarding    Specialty Medication: Capecitabine 150 mg and 500 mg tablet   Prior Authorization: Not Required   Financial Assistance: No - copay  <$25  Final Copay/Day Supply: $0.00 / 28 day supply    Insurance Restrictions: Yes - max 1 month supply     Notes to Pharmacist:     The triage team has completed the benefits investigation and has determined that the patient is able to fill this medication at [facility]. Please contact the patient to complete the onboarding or follow up with the prescribing physician as needed.

## 2020-09-25 NOTE — Unmapped (Signed)
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  I am speaking to the patient's family member, daughter Dustin Lopez.    Was a Nurse, learning disability used for this call? No    Verified patient's date of birth / HIPAA.    Specialty medication(s) to be sent: Hematology/Oncology: Capecitabine 150 and 500mg , directions: 1600mg  2 times a day Mon-Fri (radiation)      Non-specialty medications/supplies to be sent: n/a      Medications not needed at this time: n/a         Xeloda (capecitabine)    Medication & Administration     Dosage: 1600mg  (four 150mg  tablets and two 500mg  tablets) 2 times a day Monday-Friday (radiation)    Administration: I reviewed the importance of taking with a full glass of water within 30 minutes of a meal (at least 1 cup of food). Discussed that tablet should be swallowed whole and cannot be crushed or chewed.    Adherence/Missed dose instruction: If a dose is missed, do not take an extra dose or two doses at one time. Simply take your next dose at the regularly scheduled time and record any missed doses so our team is aware.    Goals of Therapy     Prevent disease progression    Side Effects & Monitoring Parameters     ??? Nausea/vomiting  ??? Diarrhea/constipation  ??? Infection precautions  ??? Fatigue  ??? Mouth sores or irritation  ??? Hand/foot syndrome (tingling, numbness, pain, redness, peeling or blistering) Use Urea 20% cream, Aquaphor, Eucerin, Cetaphil, or CeraVe twice daily on hands and feet as preventative  ??? Sun precautions  ??? Decrease appetite/ taste changes  ??? Bleeding precautions (bruising easily, nose bleeds, gums bleed)  ??? Headache  ??? Dry skin  ??? Hair loss    The following side effects should be reported to the provider:  ??? Diarrhea not controlled by anti-diarrheals  ??? Swelling, warmth, numbness, change of color or pain in a leg or arm  ??? Signs of infection (fever >100.4, chills, sore throat, sputum production)  ??? Signs of liver problems (dark urine, abdominal pain, light-colored stools, vomiting, yellow skin or eyes)  ??? Signs of bleeding (vomiting or coughing up blood, blood that looks like coffee grounds, blood in the urine or black, red tarry stools, bruising that gets bigger without reason, any persistent or severe bleeding)  ??? Skin rash or signs of skin infection (cracking, peeling, blistering, bleeding skin, red or irritated eyes)  ??? Signs of fluid and electrolyte problems (mood changes, confusion, abnormal/fast  heartbeat, severe dizziness, passing out, increased thirst, seizures, loss of strength and energy, lack of appetite, unable to pass urine or change in amount of urine passed, dry mouth, dry eyes, or nausea or vomiting.  ??? Signs of hand foot syndrome (redness or irritation on the palms of the hands or soles of the feet)  ??? Signs of mucositis (red or swollen mouth/gums, sores in the mouth, gums or tongue, soreness or pain in the mouth or throat, difficulty swallowing or talking, dryness, mild burning, or pain when eating food white patches or pus in the mouth or on the tongue, increased mucus or thicker saliva)  ??? Signs of anaphylaxis (wheezing, chest tightness, swelling of face, lips, tongue or throat)    Monitoring parameters: Renal function should be estimated at baseline to determine initial dose. During therapy,  CBC with differential, hepatic function, and renal function should be monitored. Monitor INR closely if receiving concomitant warfarin. Pregnancy test prior to treatment initiation (in females of reproductive potential). Monitor for diarrhea, dehydration, hand-foot syndrome, Stevens-Johnson syndrome, toxic epidermal necrolysis, stomatitis, and cardiotoxicity. Monitor adherence.    Contraindications, Warnings & Precautions     [place] Boxed Warning: Capecitabine may increase the anticoagulant effects of warfarin; bleeding events, including death, have occurred with concomitant use. Clinically significant increases in prothrombin time (PT) and INR have occurred within several days to months after capecitabine initiation (in patients previously stabilized on anticoagulants), and may continue up to 1 month after capecitabine discontinuation. May occur in patients with or without liver metastases. Monitor PT and INR frequently and adjust anticoagulation dosing accordingly. An increased risk of coagulopathy is correlated with a cancer diagnosis and age >60 years.    Contraindications:  ??? Known hypersensitivity to capecitabine, fluorouracil, or any component of the formulation;   ??? Severe renal impairment (CrCl <30 mL/minute)    Warnings and Precautions:  ??? Bone marrow suppression  ??? Cardiotoxicity: Myocardial infarction, ischemia, angina, dysrhythmias, cardiac arrest, cardiac failure, sudden death, ECG changes, and cardiomyopathy  ??? Mouth sores-discussed use of baking soda/salt water rinses  ??? Dermatologic toxicity: Stevens-Johnson syndrome and toxic epidermal necrolysis   ??? Hand/foot prevention (moisturizers, luke warm hand washes, patting hands/feet dry, decrease rubbing on hands/feet)  ??? GI toxicity: May cause diarrhea (may be severe); Importance of good nutrition to help minimize diarrhea (high protein, BRAT, yogurt, avoid greasy and spicy foods).  Importance of hydration if having frequent diarrhea  ??? Reproductive concerns: Evaluate pregnancy status prior to therapy in females of reproductive potential. Females of reproductive potential should use effective contraception during treatment and for 6 months after the last dose. Males with male partners of reproductive potential should use effective contraception during treatment and for 3 months after the last dose. Based on the mechanism of action and data from animal reproduction studies, in utero exposure to capecitabine may cause fetal harm.   ??? Breast feeding considerations: It is not known if capecitabine is present in breast milk. Due to the potential for serious adverse reactions in the breastfed infant, breastfeeding is not recommended by the manufacturer during treatment and for 2 weeks after the last dose.    Drug/Food Interactions     ??? Medication list reviewed in Epic. The patient was instructed to inform the care team before taking any new medications or supplements. No drug interactions identified.   ??? Avoid live vaccines.    Storage, Handling Precautions, & Disposal     ??? This medication should be stored at room temperature and in a dry location. Keep out of reach of others including children and pets. Keep the medicine in the original container with a child-proof top (no pillboxes). Do not throw away or flush unused medication down the toilet or sink. This drug is considered hazardous and should be handled as little as possible.  If someone else helps with medication administration, they should wear gloves.      Current Medications (including OTC/herbals), Comorbidities and Allergies     Current Outpatient Medications   Medication Sig Dispense Refill   ??? acetaminophen (TYLENOL) 325 MG tablet Take 2 tablets (650 mg total) by mouth Every six (6) hours. 24 tablet 0   ??? albuterol HFA 90 mcg/actuation inhaler Inhale 2 puffs every six (6) hours as needed for wheezing. 18 g 5   ??? apixaban (ELIQUIS) 5 mg Tab Take  1 tablet (5 mg total) by mouth two (2) times a day. 180 tablet 3   ??? aspirin 81 MG chewable tablet Chew 1 tablet (81 mg total) daily. (Patient taking differently: Chew 81 mg nightly. ) 90 tablet 3   ??? atorvastatin (LIPITOR) 40 MG tablet Take 1 tablet (40 mg total) by mouth nightly at bedtime. 90 tablet 3   ??? capecitabine (XELODA) 150 MG tablet Take 4 tablets (600 mg total) by mouth Two (2) times a day  with 1 other capecitabine prescription for 1,600 mg total. Take Monday through Friday during Radiation. 240 tablet 0   ??? capecitabine (XELODA) 500 MG tablet Take 2 tablets (1,000 mg total) by mouth Two (2) times a day  with 1 other capecitabine prescription for 1,600 mg total. Take Monday through Friday during Radiation. 120 tablet 0   ??? carBAMazepine (CARBATROL) 200 MG 12 hr capsule Take 2 capsule by mouth twice daily. 360 capsule 1   ??? fluticasone propion-salmeteroL (ADVAIR) 250-50 mcg/dose diskus Inhale 1 puff 2 (two) times a day. 180 each 3   ??? furosemide (LASIX) 20 MG tablet Take 1 tablet (20 mg total) by mouth daily as needed for swelling or other (for worsened sob). 30 tablet 5   ??? gabapentin (NEURONTIN) 600 MG tablet Take 1 tablet (600 mg total) by mouth Two (2) times a day. (Patient taking differently: Take 600 mg by mouth Two (2) times a day. Pt taking only at night) 180 tablet 3   ??? heparin, porcine, PF, 100 unit/mL Syrg Infuse 5 mL into a venous catheter every fourteen (14) days. For use while patient is on fluorouracil (5-FU) home infusion.   Flush IV catheter with heparin 5 mL as directed after final saline flush per SASH method and as needed for line maintenance. 4 each PRN   ??? ibuprofen (MOTRIN) 800 MG tablet Take 800 mg by mouth every six (6) hours as needed.     ??? lidocaine-prilocaine (EMLA) 2.5-2.5 % cream Apply to port 60 min prior to chemo infusion 5 g 3   ??? lisinopriL-hydrochlorothiazide (PRINZIDE,ZESTORETIC) 20-25 mg per tablet Take 1 tablet by mouth daily. 90 tablet 3   ??? loperamide (IMODIUM) 2 mg capsule Take 2 capsules to start then 1 capsule every 2 hours until diarrhea free for 12 hours. 60 capsule 2   ??? LORazepam (ATIVAN) 1 MG tablet Take 1 tablet (1 mg total) by mouth nightly as needed (insomnia). 30 tablet 0   ??? metoprolol tartrate (LOPRESSOR) 100 MG tablet Take 1 tablet (100 mg total) by mouth two (2) times a day. 180 tablet 3   ??? metroNIDAZOLE (FLAGYL) 500 MG tablet Take 1 tablet (500 mg total) by mouth Three (3) times a day for 14 days. 42 tablet 0   ??? oxyCODONE (ROXICODONE) 5 MG immediate release tablet Take 1 tablet (5 mg total) by mouth every four (4) hours as needed for pain. 42 tablet 0   ??? oxyCODONE-acetaminophen (PERCOCET) 5-325 mg per tablet Take 1 tablet by mouth every eight (8) hours as needed for pain. 30 tablet 0   ??? prochlorperazine (COMPAZINE) 10 MG tablet Take 1 tablet (10 mg total) by mouth every six (6) hours as needed (Nausea/Vomiting). 30 tablet 2   ??? sodium chloride (NS) 0.9 % injection Infuse 10 mL into a venous catheter every fourteen (14) days. For use while patient is on fluorouracil (5-FU) home infusion.   Flush IV catheter with normal saline 10 mL prior to and after infusion followed by  heparin flush per SASH method and as needed for line maintenance. 6 each PRN   ??? traZODone (DESYREL) 100 MG tablet Take 1 tablet (100 mg total) by mouth daily. 90 tablet 3     No current facility-administered medications for this visit.     Facility-Administered Medications Ordered in Other Visits   Medication Dose Route Frequency Provider Last Rate Last Admin   ??? albumin human 5 % bottle 25 g  25 g Intravenous Once Leia Alf, MD       ??? albumin human 5 % bottle 25 g  25 g Intravenous Once Leia Alf, MD           No Known Allergies    Patient Active Problem List   Diagnosis   ??? Perirectal abscess   ??? Hypertension   ??? Tobacco dependence   ??? Rectal abscess   ??? Anal fistula   ??? S/P colostomy takedown   ??? Stroke (CMS-HCC)   ??? Atrial fibrillation (CMS-HCC)   ??? CKD (chronic kidney disease)   ??? Hyperlipidemia   ??? COPD (chronic obstructive pulmonary disease) (CMS-HCC)   ??? Partial symptomatic epilepsy (CMS-HCC)   ??? Insomnia   ??? SOB (shortness of breath)   ??? Smoking   ??? Thoracic aortic aneurysm without rupture (CMS-HCC)   ??? Current use of long term anticoagulation   ??? Cardiomyopathy (CMS-HCC)   ??? Anal adenocarcinoma (CMS-HCC)   ??? Hypotension       Reviewed and up to date in Epic.    Appropriateness of Therapy     Is medication and dose appropriate based on diagnosis? Yes    Prescription has been clinically reviewed: Yes    Baseline Quality of Life Assessment How many days over the past month did your condition  keep you from your normal activities? For example, brushing your teeth or getting up in the morning. 0    Financial Information     Medication Assistance provided: None Required    Anticipated copay of $0 reviewed with patient. Verified delivery address.    Delivery Information     Scheduled delivery date: 10/05/20    Expected start date: 10/17/20    Medication will be delivered via Next Day Courier to the prescription address in [facility].  This shipment will require a signature.      Explained the services we provide at [facility] and that each month we would call to set up refills.  Stressed importance of returning phone calls so that we could ensure they receive their medications in time each month.  Informed patient that we should be setting up refills 7-10 days prior to when they will run out of medication.  A pharmacist will reach out to perform a clinical assessment periodically.  Informed patient that a welcome packet and a drug information handout will be sent.      Patient verbalized understanding of the above information as well as how to contact the pharmacy at [phone] option 4 with any questions/concerns.  The pharmacy is open Monday through Friday 8:30am-4:30pm.  A pharmacist is available 24/7 via pager to answer any clinical questions they may have.    Patient Specific Needs     - Does the patient have any physical, cognitive, or cultural barriers? No    - Patient prefers to have medications discussed with  Family Member     - Is the patient or caregiver able to read and understand education materials at  a high school level or above? Yes    - Patient's primary language is  English     - Is the patient high risk? Yes, patient is taking oral chemotherapy. Appropriateness of therapy as been assessed    - Does the patient require a Care Management Plan? No     - Does the patient require physician intervention or other additional services (i.e. nutrition, smoking cessation, social work)? No      [facility] Specialty Pharmacist

## 2020-09-28 NOTE — Unmapped (Signed)
-----   Message from Zachary George Daugherty sent at 09/26/2020  2:29 PM EST -----  Lm for pt to call back to schedule  ----- Message -----  From: Zachary George Daugherty  Sent: 09/20/2020   8:50 AM EST  To: [facility] Front Desk    L/m for pt to call back to schedule   ----- Message -----  From: Elyse Hsu, MD  Sent: 09/19/2020   5:26 PM EST  To: [facility] [employer]    Needs follow up with me on 11/30. 11:30 or 12 pm

## 2020-09-28 NOTE — Unmapped (Signed)
Left 3rd message for pt to call back.  

## 2020-10-02 ENCOUNTER — Ambulatory Visit: Admit: 2020-10-02 | Discharge: 2020-10-30 | Payer: MEDICARE

## 2020-10-02 ENCOUNTER — Ambulatory Visit
Admit: 2020-10-02 | Discharge: 2020-10-30 | Payer: MEDICARE | Attending: Radiation Oncology | Primary: Radiation Oncology

## 2020-10-02 DIAGNOSIS — C210 Malignant neoplasm of anus, unspecified: Principal | ICD-10-CM

## 2020-10-02 NOTE — Unmapped (Signed)
Patient Education        Learning About External Beam Radiation Treatment  What is external beam radiation therapy for cancer?     Radiation treatment uses high-energy rays or radioactive material to kill cancer cells or to keep them from growing. In external beam treatment, a beam of radiation is aimed at the tumor from outside the body. This treatment is usually given 5 days a week, over the course of a few weeks. There may be other treatment schedules. For example, some schedules have treatments 2 times a day. How long your treatment lasts depends on the type of cancer you have.  One common form of external beam treatment is intensity-modulated radiation therapy (IMRT). IMRT uses a precise amount of radiation that is carefully targeted at a tumor. This limits radiation exposure to healthy tissue. The treatment itself is painless.  External beam radiation treatment can cause some side effects. It can make the skin near the treated area sore. The skin may turn red or dark, like a burn. The treatment can cause fatigue, nausea, vomiting, and diarrhea. Areas inside your body can get sore. For instance, your throat may hurt if the radiation is aimed there. Most side effects usually go away after treatment ends. But you may feel very tired for 4 to 6 weeks after your last treatment. Talk to your doctor about ways to treat the side effects. Skin changes may not go away.  When you find out that you have cancer, you may feel many emotions and may need some help coping. Seek out family, friends, and counselors for support. You also can do things at home to make yourself feel better while you go through treatment. Your doctor will tell you what activities you can do during treatment. Call the [employer] (531-325-0304) or visit its website at www.cancer.org for more information. You can also call the [employer] (629-634-4172) or visit its website at www.cancer.gov to learn more.  How is it done?  ?? You may need to put on a hospital gown or robe. Wear clothing that is comfortable and easy to take off and put on again later.  ?? The radiation therapist will mark the treatment spot with tattoos or tiny dots of ink. The radiation must focus on the same area every time. If the dots seem to fade between treatments, tell your radiation therapist.  ?? You may have shields, or blocks, between the machine and other parts of your body. The shields protect those parts of your body from radiation.  ?? You'll be asked not to move during treatment. If you're having radiation treatment to the head, you may wear a special mask over your face to keep your head still. If you keep still, then the radiation goes only where it is needed. You can breathe normally.  ?? Expect the radiation machine to make noises, such as humming and clicking. The sound may worry you, but the machine is under the therapist's control. The machine is checked often to make sure it works as it should. If you are nervous, tell the radiation therapist. Ask any questions you have.  ?? Talk to your doctor about what activities you can do before, during, and after treatments.  How long does it take?  External beam treatment takes only a few minutes. But allow 30 minutes to an hour for each visit.  Follow-up care is a key part of your treatment and safety. Be sure to make and go to all appointments,  and call your doctor if you are having problems. It's also a good idea to know your test results and keep a list of the medicines you take.  Where can you learn more?  Go to [facility] at [email or web address]/support/administrative officer in the Menu. Enter Z105 in the search box to learn more about Learning About External Beam Radiation Treatment.  Current as of: June 07, 2020??????????????????????????????Content Version: 13.1  ?? 2006-2021 [employer].   Care instructions adapted under license by [facility] If you have questions about a medical condition or this instruction, always ask your healthcare professional. [employer] disclaims any warranty or liability for your use of this information.    Small Bowel and Bladder Protocol Instructions:    It is important to follow these instructions on the day of your simulation and on your treatment days.  Please let nursing know if you are having any difficulty with these instructions.    On the day of CT Simulation:  1.  It is important to have your bladder full and rectum empty at the time of simuation for treatment planning.   2.  One hour before your appointment, empty your bladder (urinate).  3.  Try to drink 16 ounces of water over 30 minutes.  4. Do not empty your bladder until after your planning session.    On Treatment Days:  1. Try to have regular, soft bowel movements every morning prior to each radiation treatment.  2. Empty your bladder and start drinking 16 ounces of water 30-60 minutes prior to your radiation treatment.  Do what you can to have a comfortably full bladder at the time of your radiation treatment.  This can be started prior to your arrival at the radiation center.  3. Do not empty your bladder again until after your radiation treatment.

## 2020-10-02 NOTE — Unmapped (Signed)
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 Wife at side. Pt completed neo chemo on 12/15 with Dr Jomarie Longs. Nutrition score - 2; pt states some weight loss and no appetite with chemo. Will monitor with start of PO Xeloda/XRT. Reviewed meds and allergies with pt. Distress 2. 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  No RN for exam; no sensitive exam. Dr Ottis Stain into exam and discussed diagnosis, treatment and planning with side effects. CT simulation followed appt with Dr Ottis Stain today. New patient packet provided to patient. Discussed potential side effects, treatment plan, simulation, bladder filling, treatment schedule and weekly treatment checks. Printed bowel/bladder preparation instructions for day of CT/Sim. Office contact information provided to patient. All questions and concerns addressed. Pt has not rec'v PO Xeloda at this time. Will coordinate chemo/XRT with Dr Jomarie Longs office.

## 2020-10-02 NOTE — Unmapped (Signed)
Radiation Oncology Consultation-in-Progress Note    Patient Name: Dustin Lopez  Patient Age: 65 y.o.  Encounter Date: 10/02/2020    Referring Physician:     Elyse Hsu, MD  [address]  [address]  [place],  [place] [address]    Primary Care Provider:  Star Age, [place]    Diagnoses:  1. Anal adenocarcinoma (CMS-HCC)        Stage:  MW1U2    Chief Complaint: Here to initiate radiotherapy planning for anal adenocarcinoma.    Reason for Consultation:   Dustin Lopez is a 65 y.o. male who is seen in consultation at the request of Elyse Hsu, MD for an opinion regarding the use of radiation therapy in the treatment of his anal adenocarcinoma.    Assessment:  Mr. 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  MRI consistent with cT4N1 disease.  Tumor board consensus is for total neoadjuvant therapy followed by APR.  He has completed FOLFOX x 8 with C8D1 on 09/13/20 and is ready to proceed with neoadjuvant chemoradiotherapy (with concurrent Xeloda).    Recommendations/Plan:  We reviewed logistics of radiotherapy, including need for CT simulation followed by an approximately 6-week course of daily treatments.  Risks, benefits and expected side effects of treatment, including, but not limited to, fatigue, loose stool/diarrhea, irritative urinary symptoms (frequency, urgency, dysuria), and likely painful perianal and inguinal skin reaction were reviewed.  Potential late effects of treatment, including, but not limited to, chronic GI/GU symptoms as above, risk of late bowel obstruction, and small risk of secondary malignancy were discussed.  After discussion, the patient held his questions addressed to his full satisfaction and wish to proceed.  Written informed consent for treatment was signed at today's visit.  He will undergo CT simulation this afternoon with plan to begin his treatments in about 2 weeks.        History of Present Illness:  Mr. Dustin Lopez is a 65 year old male with a h/o seizure disorder, remote CVA who was recently diagnosed with anal adenocarcinoma arising in the setting of a 6 year history of recurrent perianal abscess and associated cutaneous fistulae.  Originally presented in 02/2014 with perianal abscess, s/p I&D at [facility].  After several more debridement/washouts, he underwent a laparoscopy sigmoid loop ileostomy 03/21/14.  Colonscopy in 10/2014 with removal of several benign polyps, but no evidence of underlying inflammatory bowel disease.  Despite continued poor wound healing, he underwent ileostomy takedown in 01/2015 per his preference.  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  These were replaced in 2019.  He continued to have difficulty with drainage from these areas.  Seen by Dr. Peri Jefferson in 03/2020, at which point fisulotomies were performed, but the ischiorectal abscess cavity looked unusual and was unroofed, with drainage of mucin like material.  Biopsy of this area was taken, with pathology showing mucinous adenocarcinoma.    Staging work-up then proceeded with MRI of the pelvis, showing an extensive mucinous perianal neoplasm, as detailed below:  1. Extensive perianal and distal perirectal T2 hyperintense masses that exhibit employed morphology following enhancement consistent with mucinous etiology. The cephalad extent of the masses is at the level of the bladder base and prostate base. The caudal extent of the masses extends beyond the anal verge.     2. These masses appear to be  situated in the intersphincteric space on the right, posterior and on the left.     3. These masses appear to communicate with and/or infiltrate the internal sphincter at multiple levels, likely along fistulous tracts.     4. Peripherally, the masses invade the external sphincter bilaterally. More cephalad, there is invasion of the left obturator internus muscle but not the acetabular bone, 21:54 and 22:25. On the right, there is minimal invasion of the caudal margin of the obturator internus muscle.     5. There is circumferential invasion of the skin beyond the anal verge, 24:53..     6. There is tumor abuts the lateral margin of the prostate apex on the left and dorsal surface on the right, 21:56.     7. Tumor invades the periosteal margin of the distal 2 coccygeal segments, 23:41.     8. Minimal to no enhancing tissue in the anal canal.     9. Single node in the presacral space, 11:79, measures 5 mm short axis. There is no evidence for inguinal or mesenteric adenopathy.      Staging CT C/A/P redemonstrated the perianal/ischiorectal mass, without specific evidence of distant metastatic disease.    His case was discussed at tumor board, with consensus opinion to proceed with total neoadjuvant therapy followed by APR.    Today, he confirms the above history.  Denies any bleeding associated with his perirectal/perianal disease, but does not continued mucoid discharge.  Wears a diaper due to this.  Does note 4/10 pain in the area, intermittent, exacerbated by activity.  Does note about 10 pounds of weight loss in last several months.      UPDATE 10/02/20:  Since last seen, he has been receiving FOLFOX with Dr. Jomarie Longs.  Tolerated reasonably well though with marked fatigue and some decreased appetite secondary to nausea.  Has also had exacerbation of LLE neuropathic pain.  Restaging chest CT 08/21/20 without evidence of metastatic disease, MRI pelvis with mixed response (mild interval reduction in size of the larger bilateral superior perianal/perirectal masses but mild interval enlargement of the inferior portion of the left perianal/ischioanal mass).  MRI abdomen 08/26/20 without evidence of metastatic disease.  Next restaging imaging planned after chemoradiation.      Past Medical History: Diagnosis Date   ??? Abnormal ECG    ??? ARF (acute renal failure) (CMS-HCC) 03/17/2014   ??? Atrial fibrillation (CMS-HCC)    ??? Colon cancer (CMS-HCC)    ??? COPD (chronic obstructive pulmonary disease) (CMS-HCC)    ??? Crohn's disease (CMS-HCC)    ??? Hyperlipidemia    ??? Hypertension    ??? Rectal bleeding    ??? Seizures (CMS-HCC)    ??? Sepsis (CMS-HCC) 03/17/2014   ??? Stroke (CMS-HCC)     difficulty with numbers and time; no other deficits      Prior Radiation Therapy:no  Pacemaker: no  Pregnancy status: No; male patient    Past Surgical History:   Procedure Laterality Date   ??? COLON SURGERY     ??? FRACTURE SURGERY     ??? PR CLOSE ENTEROSTOMY,RESEC+ANAST Midline 02/10/2015    Procedure: CLOSURE OF ENTEROSTOMY, LARGE OR SMALL INTESTINE; WITH RESECTION & ANASTOMOSIS, OTHER THAN COLORECTAL;  Surgeon: Daphane Shepherd, MD;  Location: MAIN OR [facility];  Service: Trauma   ??? PR COLONOSCOPY FLX DX W/COLLJ SPEC WHEN PFRMD N/A 05/25/2020    Procedure: COLONOSCOPY;  Surgeon: Ree Kida, MD;  Location: OR ;  Service: Gastroenterology   ??? PR COLONOSCOPY THRU STOMA,LESN  RMVL W/SNARE  11/10/2014    Procedure: COLONSCOPY-STOMA; W/REMOV TUMOR/POLYP/LES;  Surgeon: Liane Comber, MD;  Location: GI PROCEDURES [place] [facility];  Service: Gastroenterology   ??? PR DRESSING CHANGE,NOT FOR BURN N/A 03/21/2014    Procedure: DRESSING CHANGE (FOR OTHER THAN BURNS) UNDER ANESTHESIA (OTHER THAN LOCAL);  Surgeon: Daphane Shepherd, MD;  Location: MAIN OR [facility];  Service: Trauma   ??? PR I&D PERIANAL ABSCESS,SUPERFICIAL N/A 06/18/2017    Procedure: INCISION AND DRAINAGE, PERIANAL ABSCESS, SUPERFICIAL;  Surgeon: Raye Sorrow, MD;  Location: MAIN OR [place];  Service: Gastrointestinal   ??? PR I&D PERIRECTAL ABSCESS Bilateral 03/17/2014    Procedure: incision and drainage of perirectal and ischiorectal fossa abscess;  Surgeon: Bo Mcclintock, MD;  Location: MAIN OR [facility];  Service: Trauma   ??? PR I&D PERIRECTAL ABSCESS Bilateral 03/18/2014    Procedure: INCISION AND DRAINAGE OF ISCHIORECTAL AND/OR PERIRECTAL ABSCESS (SEPARATE PROCEDURE);  Surgeon: Lynnell Dike, MD;  Location: MAIN OR [facility];  Service: Trauma   ??? PR I&D PERIRECTAL ABSCESS Midline 03/19/2014    Procedure: INCISION AND DRAINAGE OF ISCHIORECTAL AND/OR PERIRECTAL ABSCESS (SEPARATE PROCEDURE);  Surgeon: Lynnell Dike, MD;  Location: MAIN OR [facility];  Service: Trauma   ??? PR I&D PERIRECTAL ABSCESS N/A 09/02/2014    Procedure: INCISION AND DRAINAGE OF ISCHIORECTAL AND/OR PERIRECTAL ABSCESS (SEPARATE PROCEDURE);  Surgeon: Daphane Shepherd, MD;  Location: MAIN OR [facility];  Service: Trauma   ??? PR I&D PERIRECTAL ABSCESS  11/28/2017    Procedure: Incision And Drainage Of Ischiorectal And/Or Perirectal Abscess (Separate Procedure);  Surgeon: Raye Sorrow, MD;  Location: MAIN OR [facility];  Service: Gastrointestinal   ??? PR INSERT TUNNELED CV CATH WITH PORT N/A 05/25/2020    Procedure: PORT PLACEMENT;  Surgeon: Ree Kida, MD;  Location: OR [place];  Service: Gastroenterology   ??? PR LAP, SURG COLOSTOMY N/A 03/21/2014    Procedure: LAPAROSCOPY, SURGICAL, COLOSTOMY OR SKIN LEVEL CECOSTOMY;  Surgeon: Daphane Shepherd, MD;  Location: MAIN OR [facility];  Service: Trauma   ??? PR NDSC EVAL INTSTINAL POUCH DX W/COLLJ SPEC SPX  11/10/2014    Procedure: ENDO EVAL SM INTEST POUCH; DX;  Surgeon: Liane Comber, MD;  Location: [place] PROCEDURES [place] [facility];  Service: Gastroenterology   ??? PR PELVIC EXAMINATION W ANESTH Midline 03/17/2014    Procedure: exam under anesthesia;  Surgeon: Bo Mcclintock, MD;  Location: MAIN OR [facility];  Service: Trauma   ??? PR PELVIC EXAMINATION W ANESTH N/A 03/20/2014    Procedure: PELVIC EXAMINATION UNDER ANESTHESIA (OTHER THAN LOCAL);  Surgeon: Lynnell Dike, MD;  Location: MAIN OR [facility];  Service: Trauma   ??? PR PLACEMENT,SETON N/A 06/18/2017    Procedure: PLACEMENT OF SETON;  Surgeon: Raye Sorrow, MD;  Location: MAIN OR [facility];  Service: Gastrointestinal   ??? PR PLACEMENT,SETON N/A 11/28/2017    Procedure: PLACEMENT OF SETON; Surgeon: Raye Sorrow, MD;  Location: MAIN OR [place];  Service: Gastrointestinal   ??? PR PLACEMENT,SETON N/A 04/18/2020    Procedure: RECTAL EXAM UNDER ANESTHESIA, SETON PLACEMENT;  Surgeon: Ree Kida, MD;  Location: OR [place];  Service: General Surgery   ??? PR REMOVAL ANAL FISTULA,INTERSPNINCTERIC Left 12/16/2014    Procedure: SURGICAL TREATMENT OF ANAL FISTULA (FISTULECTOMY/FISTULOTOMY); INTERSPHINCTERIC;  Surgeon: Daphane Shepherd, MD;  Location: MAIN OR [facility];  Service: Trauma   ??? PR REMOVAL ANAL FISTULA,SUBCUTANEOUS N/A 11/28/2017    Procedure: SURGICAL TREATMENT OF ANAL FISTULA (FISTULECTOMY/FISTULOTOMY); SUBCUTANEOUS;  Surgeon: Raye Sorrow, MD;  Location: MAIN OR ;  Service: Gastrointestinal   ???  PR SURG DIAGNOSTIC EXAM, ANORECTAL N/A 03/21/2014    Procedure: ANORECTAL EXAM, SURGICAL, REQUIRING ANESTHESIA (GENERAL, SPINAL, OR EPIDURAL), DIAGNOSTIC;  Surgeon: Daphane Shepherd, MD;  Location: MAIN OR [facility];  Service: Trauma   ??? PR SURG DIAGNOSTIC EXAM, ANORECTAL N/A 06/18/2017    Procedure: ANORECTAL EXAM, SURGICAL, REQUIRING ANESTHESIA (GENERAL, SPINAL, OR EPIDURAL), DIAGNOSTIC;  Surgeon: Raye Sorrow, MD;  Location: MAIN OR [place];  Service: Gastrointestinal   ??? PR SURG DIAGNOSTIC EXAM, ANORECTAL N/A 11/28/2017    Procedure: ANORECTAL EXAM, SURGICAL, REQUIRING ANESTHESIA (GENERAL, SPINAL, OR EPIDURAL), DIAGNOSTIC;  Surgeon: Raye Sorrow, MD;  Location: MAIN OR ;  Service: Gastrointestinal   ??? SMALL INTESTINE SURGERY          Family History   Problem Relation Age of Onset   ??? Heart attack Father    ??? Alcohol abuse Father    ??? COPD Father    ??? Heart disease Father    ??? Hypertension Father    ??? Seizures Other    ??? Hypertension Mother    ??? COPD Sister    ??? Hypertension Sister    ??? Arthritis Neg Hx    ??? Asthma Neg Hx    ??? Cancer Neg Hx    ??? Depression Neg Hx    ??? Diabetes Neg Hx    ??? Drug abuse Neg Hx    ??? Early death Neg Hx    ??? Hyperlipidemia Neg Hx    ??? Kidney disease Neg Hx    ??? Mental illness Neg Hx    ??? Mental retardation Neg Hx    ??? Hearing loss Neg Hx    ??? Miscarriages / Stillbirths Neg Hx    ??? Stroke Neg Hx    ??? Birth defects Neg Hx    ??? Vision loss Neg Hx    ??? Learning disabilities Neg Hx    ??? Lupus Neg Hx    ??? Angina Neg Hx    ??? Aneurysm Neg Hx    ??? Liver disease Neg Hx    ??? Clotting disorder Neg Hx    ??? Ulcers Neg Hx    ??? GER disease Neg Hx    ??? Thyroid disease Neg Hx    ??? Glaucoma Neg Hx         Social History     Occupational History   ??? Not on file   Tobacco Use   ??? Smoking status: Current Some Day Smoker     Packs/day: 0.50     Years: 45.00     Pack years: 22.50     Types: Cigarettes     Last attempt to quit: 09/2016     Years since quitting: 4.0   ??? Smokeless tobacco: Never Used   ??? Tobacco comment: pt has decreased to 1/2 pk/day   Vaping Use   ??? Vaping Use: Never used   Substance and Sexual Activity   ??? Alcohol use: No   ??? Drug use: No   ??? Sexual activity: Not Currently       No Known Allergies    Current Outpatient Medications   Medication Sig Dispense Refill   ??? acetaminophen (TYLENOL) 325 MG tablet Take 2 tablets (650 mg total) by mouth Every six (6) hours. 24 tablet 0   ??? albuterol HFA 90 mcg/actuation inhaler Inhale 2 puffs every six (6) hours as needed for wheezing. 18 g 5   ??? apixaban (ELIQUIS) 5 mg Tab Take 1 tablet (5 mg total) by mouth two (  2) times a day. 180 tablet 3   ??? aspirin 81 MG chewable tablet Chew 1 tablet (81 mg total) daily. (Patient taking differently: Chew 81 mg nightly. ) 90 tablet 3   ??? atorvastatin (LIPITOR) 40 MG tablet Take 1 tablet (40 mg total) by mouth nightly at bedtime. 90 tablet 3   ??? carBAMazepine (CARBATROL) 200 MG 12 hr capsule Take 2 capsule by mouth twice daily. 360 capsule 1   ??? fluticasone propion-salmeteroL (ADVAIR) 250-50 mcg/dose diskus Inhale 1 puff 2 (two) times a day. 180 each 3   ??? furosemide (LASIX) 20 MG tablet Take 1 tablet (20 mg total) by mouth daily as needed for swelling or other (for worsened sob). 30 tablet 5   ??? gabapentin (NEURONTIN) 600 MG tablet Take 1 tablet (600 mg total) by mouth Two (2) times a day. (Patient taking differently: Take 600 mg by mouth Two (2) times a day. Pt taking only at night) 180 tablet 3   ??? ibuprofen (MOTRIN) 800 MG tablet Take 800 mg by mouth every six (6) hours as needed.     ??? lidocaine-prilocaine (EMLA) 2.5-2.5 % cream Apply to port 60 min prior to chemo infusion 5 g 3   ??? lisinopriL-hydrochlorothiazide (PRINZIDE,ZESTORETIC) 20-25 mg per tablet Take 1 tablet by mouth daily. 90 tablet 3   ??? loperamide (IMODIUM) 2 mg capsule Take 2 capsules to start then 1 capsule every 2 hours until diarrhea free for 12 hours. 60 capsule 2   ??? LORazepam (ATIVAN) 1 MG tablet Take 1 tablet (1 mg total) by mouth nightly as needed (insomnia). 30 tablet 0   ??? metoprolol tartrate (LOPRESSOR) 100 MG tablet Take 1 tablet (100 mg total) by mouth two (2) times a day. 180 tablet 3   ??? oxyCODONE (ROXICODONE) 5 MG immediate release tablet Take 1 tablet (5 mg total) by mouth every four (4) hours as needed for pain. 42 tablet 0   ??? prochlorperazine (COMPAZINE) 10 MG tablet Take 1 tablet (10 mg total) by mouth every six (6) hours as needed (Nausea/Vomiting). 30 tablet 2   ??? traZODone (DESYREL) 100 MG tablet Take 1 tablet (100 mg total) by mouth daily. 90 tablet 3   ??? capecitabine (XELODA) 150 MG tablet Take 4 tablets (600 mg total) by mouth Two (2) times a day  with 1 other capecitabine prescription for 1,600 mg total. Take Monday through Friday during Radiation. (Patient not taking: Reported on 10/02/2020) 240 tablet 0   ??? capecitabine (XELODA) 500 MG tablet Take 2 tablets (1,000 mg total) by mouth Two (2) times a day  with 1 other capecitabine prescription for 1,600 mg total. Take Monday through Friday during Radiation. (Patient not taking: Reported on 10/02/2020) 120 tablet 0   ??? heparin, porcine, PF, 100 unit/mL Syrg Infuse 5 mL into a venous catheter every fourteen (14) days. For use while patient is on fluorouracil (5-FU) home infusion.   Flush IV catheter with heparin 5 mL as directed after final saline flush per SASH method and as needed for line maintenance. (Patient not taking: Reported on 10/02/2020) 4 each PRN   ??? oxyCODONE-acetaminophen (PERCOCET) 5-325 mg per tablet Take 1 tablet by mouth every eight (8) hours as needed for pain. (Patient not taking: Reported on 10/02/2020) 30 tablet 0   ??? sodium chloride (NS) 0.9 % injection Infuse 10 mL into a venous catheter every fourteen (14) days. For use while patient is on fluorouracil (5-FU) home infusion.   Flush IV catheter with normal saline  10 mL prior to and after infusion followed by heparin flush per SASH method and as needed for line maintenance. (Patient not taking: Reported on 10/02/2020) 6 each PRN     No current facility-administered medications for this visit.     Facility-Administered Medications Ordered in Other Visits   Medication Dose Route Frequency Provider Last Rate Last Admin   ??? albumin human 5 % bottle 25 g  25 g Intravenous Once Leia Alf, MD       ??? albumin human 5 % bottle 25 g  25 g Intravenous Once Leia Alf, MD           Review of Systems:    A comprehensive review of 10 systems was negative except for pertinent positives noted in HPI.    Physical Exam:     Vital Signs for this encounter:  BSA: 1.91 meters squared  BP 130/85  - Pulse 78  - Temp 36.8 ??C (98.2 ??F) (Skin)  - Resp 18  - Wt 73.8 kg (162 lb 12 oz)  - SpO2 100%  - BMI 23.35 kg/m??   Pain Assessment: 0-10 Pain Scale: 3 of 10    General appearance: Alert, cooperative and no distress  Head: Normocephalic, without obvious abnormality, atraumatic  Eyes: EOMI, sclera non-icteric, non-injected  Neck: upple, symmetrical, trachea midline  Lungs: EWOB, no conversational dyspnea  Heart: regular rate, no cyanosis  Abdomen: soft, non-tender  Extremities: extremities normal, atraumatic, no cyanosis or edema   Lymph nodes: No palpable inguinal adenopathy.  Neurologic: Grossly normal, alert and oriented x 3  Psych: Affect appropriate, reasonable responses to questions, good historian  Rectal:  Stable appearance of cutaneous fistulae superior to perianal skin, mucopurulent discharge, seton in place.      ECOG Performance Status:  Symptomatic; fully ambulatory    Imaging and Pathology    Imaging and pathology were personally reviewed as detailed in the HPI.     Distress Score: 2/10.  No SW intervention indicated today.    Benedetto Goad, MD  [facility] Radiation Oncology at [facility]        Total time spent: Approximately 60 minutes, greater than 50% of which was spent in direct counseling.

## 2020-10-04 ENCOUNTER — Ambulatory Visit: Admit: 2020-10-04 | Discharge: 2020-10-05 | Payer: MEDICARE

## 2020-10-04 DIAGNOSIS — C210 Malignant neoplasm of anus, unspecified: Principal | ICD-10-CM

## 2020-10-04 MED FILL — CAPECITABINE 150 MG TABLET: ORAL | 28 days supply | Qty: 160 | Fill #0

## 2020-10-04 MED FILL — CAPECITABINE 150 MG TABLET: 28 days supply | Qty: 160 | Fill #0 | Status: AC

## 2020-10-04 MED FILL — CAPECITABINE 500 MG TABLET: 28 days supply | Qty: 80 | Fill #0 | Status: AC

## 2020-10-05 MED ORDER — OXYCODONE 5 MG TABLET
ORAL_TABLET | ORAL | 0 refills | 7 days | Status: CP | PRN
Start: 2020-10-05 — End: 2020-10-10

## 2020-10-05 MED ORDER — OXYCODONE-ACETAMINOPHEN 5 MG-325 MG TABLET
ORAL_TABLET | Freq: Three times a day (TID) | ORAL | 0 refills | 10 days | Status: CP | PRN
Start: 2020-10-05 — End: 2020-10-10

## 2020-10-05 NOTE — Unmapped (Signed)
Oral Chemotherapy Education:    Dustin Lopez is a 65 y.o. male who presents today for virtual Chemotherapy Patient Education session.  Patient has a co-learner, his daughter Dustin Lopez, present for the session. Patient states English is the preferred language and verbalizes no need for interpreter.  Verbalizes no cultural or religious beliefs that would influence treatment plan or education.  States that the preferred learning style is Reading, Listening and Performing the Activity.  No barriers to learning were identified. Current emotional state is calm. Verbalizes interest in learning about the proposed treatment plan.     Oral chemotherapy orders reviewed. Reviewed monographs on Capecitabine from [employer].  Discussed with patient the potential side effects including nausea, vomiting, diarrhea, hand-foot syndrome, fatigue, leukopenia/neutropenia, thrombocytopenia, anemia and mucositis.  Patient receiving Xeloda (capecitabine) from [facility]. Reviewed the side effect management packet as well.  Reinforced who and how to contact office both during business hours as well after hours and weekends for emergencies. [facility] Binder provided to patient which includes contact information and additional resources such as Biochemist, clinical, social workers, and financial counselors, as well as locations of  express care centers.  Patient made aware that clinical trials are available and patient's are screened, as appropriate, by the Research Team. Reviewed information regarding how to sign up for the [facility] My Chart patient portal.      Reviewed with patient:  - Dose, schedule and any dietary restriction  - Instructed to swallow the medication whole; not to crush, chew, or break tablets or capsules; and not to take any damaged tablets or capsules.  - If a dose is skipped or missed, resume the next dose at the regular scheduled time. Advised never to double up on the dose.  - Oral chemotherapy should be stored in a dry area at room temperature away from pets and children.    - Discussed safe handling, proper storage and disposal of unused medication.    - If caregiver handles the medication, gloves should be worn when handling.  - Advised not to dispose of unused medication in the trash, toilet or water supply.    - Explained that unused medication can be brought to the clinic for disposal.               Pre-treatment labs to be drawn 09/12/20.   Gait not observed.   Height/weight not obtained as teach was virtual  Patient safe sex practices reviewed      Pain Score = not addressed     Baseline Nutriscore = not addressed.      Distress Screening Score = not addressed.      Smoking Status = Current Smoker- Interested in quitting and cessation information provided.   Line Status= Oral.    Take home medications not included other than Capecitabine.     Calendar with dose and scheduling provided.      Treatment Start Date: 10/17/20  First Follow up with provider care team: to be scheduled      Used teach back method to ensure learning took place.  Patient was able to verbalize potential toxicities, reasons to call the office immediately and how to contact our office. Direct contact information provided to the patient. Confirmed that provider explained patient's diagnosis and treatment plan and that it has been explained to patient's satisfaction. Confirmed that the provider explained alternative treatments and the risks and benefits.  Patient states that all of their questions about the treatment plan  have been answered and they have no further questions at this time. Survivorship to be discussed by the provider after completion of treatment.     Dustin Lopez Dustin Lopez Dustin Lopez

## 2020-10-05 NOTE — Unmapped (Signed)
Called patient's home phone because his cell continues to go straight to voicemail and we have been unable to connect.  Today his wife was aware of the reason for his call.  Patient is calling for refill on percocet and oxycodone.  Per his wife, he is wondering if he can get something stronger for peripheral neuropathy pain, especially to take at night.  The pain is increased at night and Dustin Lopez is unable to sleep even with percocet and oxycodone.  Dustin Lopez is also taking gabapentin as prescribed but he does not feel that this helps at all.  Advised Mrs. Starn that I would let Dr. Jomarie Longs know that Dustin Lopez is unable to sleep due to neuropathy pain and will call back to update them if Dr. Jomarie Longs decides to send anything new over to the pharmacy.

## 2020-10-06 NOTE — Unmapped (Signed)
RADIATION ONCOLOGY TREATMENT PLANNING NOTE     Patient Name: Dustin Lopez  Patient Age: 65 y.o.  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 Refer to the consult for full clinical details.    Simulation Order: I requested radiation treatment planning CT scan to acquire information regarding patient's anatomy of the treatment site, radiation treatment target volumes, and adjacent normal organs. This is required to be done in patient's treatment position for radiation treatment planning and for patient's subsequent radiation treatment positioning and treatment setups.     SIMULATION:    Type:  Initial simulation of the pelvis.    Contrast: None.    The patient was taken to the CT simulation room and placed in a supine position with customized immobilization and/or position devices including vacloc.  CT images were obtained from the mid L-spine through mid-femur. I approved the patient set up and reviewed the CT images; both are adequate. I tentatively plan to utilize multiple fields and conformal blocking. The number and use of treatment devices will be determined at the time of computerized planning.    I have placed an isocenter/localization point in three dimensions on these images.  This was marked on the patient???s skin for radiation treatment setup.  Additional details of the CT simulation are available in the departmental [facility] electronic medical record.  CT images were then transferred to dosimetry for radiation treatment planning.    CLINICAL TREATMENT PLANNING:     I plan to treat the anal canal primary tumor with margin and pelvic lymph nodes with photons utilizing IMRT  technique.  I will attempt to minimize the dose to bowel, femoral heads, external genitalia.    The total radiation dose to the anal canal primary and pelvic nodes will be 4500 cGy at 180 cGy/fraction for a total of 25 fractions, treated once a day.  This will be followed by a boost to the anal primary with margin for an additional 540 cGy in 3 daily fractions of 180 cGy each, for a total cumulative dose of 5040 cGy in 28 cumulative fractions.  Chemotherapy administered concurrently.    Treatment Intent: curative, neoadjuvant.    Technique Rationale:   IMRT:  IMRT is anticipated to be necessary to optimize the dose homogeneity to a complex tumor/target volume and minimize the dose to bowel, femoral heads, and external genitalia.    Image Fusion:  I have ordered that MRI from 07/2021 be fused to the CT simulation scan to aid in tumor mapping and treatment planning.    Special Treatment Procedure: Treatment planning for Dustin Lopez is more complex and requires more time because of concurrent chemotherapy necessitating more monitoring/management of acute toxicities.    Verification Simulation: I have ordered for the patient to come in for verification simulation on the treatment machine to ensure that information was transferred appropriately from the planning system to the treatment machine and to verify patient setup, immobilization, and image guidance.    Image Guidance/Tracking: Daily kV/kV stereoscopic/isocenter match and Biweekly CBCT/MVCT assess the position accuracy of the target volume and critical structures.       Benedetto Goad, MD

## 2020-10-08 DIAGNOSIS — C210 Malignant neoplasm of anus, unspecified: Principal | ICD-10-CM

## 2020-10-08 MED ORDER — OXYCODONE-ACETAMINOPHEN 5 MG-325 MG TABLET
ORAL_TABLET | Freq: Three times a day (TID) | ORAL | 0 refills | 10 days | PRN
Start: 2020-10-08 — End: ?

## 2020-10-10 DIAGNOSIS — C210 Malignant neoplasm of anus, unspecified: Principal | ICD-10-CM

## 2020-10-10 MED ORDER — OXYCODONE 5 MG TABLET
ORAL_TABLET | ORAL | 0 refills | 5 days | Status: CP | PRN
Start: 2020-10-10 — End: 2020-11-02

## 2020-10-10 MED ORDER — OXYCODONE-ACETAMINOPHEN 5 MG-325 MG TABLET
ORAL_TABLET | Freq: Three times a day (TID) | ORAL | 0 refills | 10 days | PRN
Start: 2020-10-10 — End: ?

## 2020-10-10 NOTE — Unmapped (Signed)
Percocet filled: 10/05/2020 30 tablets; 0 refills    Every 8 hr-4/day--should have 10 tablets left     Chart to Dr. Jomarie Longs

## 2020-10-10 NOTE — Unmapped (Signed)
Refilled 10/08/2020, too soon for refill.

## 2020-10-17 ENCOUNTER — Ambulatory Visit: Admit: 2020-10-17 | Discharge: 2020-10-18 | Payer: MEDICARE

## 2020-10-17 DIAGNOSIS — I4821 Permanent atrial fibrillation: Principal | ICD-10-CM

## 2020-10-17 DIAGNOSIS — R0602 Shortness of breath: Principal | ICD-10-CM

## 2020-10-17 DIAGNOSIS — I429 Cardiomyopathy, unspecified: Principal | ICD-10-CM

## 2020-10-19 NOTE — Unmapped (Signed)
Please call patient and let them know   that echocardiogram has abnormality with normal function of the heart  Mild aortic aneurysm  Mild leaky valves  We will continue to monitor this closely    Thank you

## 2020-10-22 DIAGNOSIS — C210 Malignant neoplasm of anus, unspecified: Principal | ICD-10-CM

## 2020-10-22 DIAGNOSIS — E876 Hypokalemia: Principal | ICD-10-CM

## 2020-10-22 MED ORDER — KLOR-CON M20 MEQ TABLET,EXTENDED RELEASE
ORAL_TABLET | 0 refills | 0 days
Start: 2020-10-22 — End: ?

## 2020-10-22 MED ORDER — LORAZEPAM 1 MG TABLET
ORAL_TABLET | Freq: Every evening | ORAL | 0 refills | 0 days | PRN
Start: 2020-10-22 — End: ?

## 2020-10-23 DIAGNOSIS — C210 Malignant neoplasm of anus, unspecified: Principal | ICD-10-CM

## 2020-10-23 MED ORDER — PROCHLORPERAZINE MALEATE 10 MG TABLET
ORAL_TABLET | Freq: Four times a day (QID) | ORAL | 2 refills | 8 days | PRN
Start: 2020-10-23 — End: ?

## 2020-10-23 NOTE — Unmapped (Signed)
RADIATION TREATMENT MANAGEMENT NOTE     Encounter Date: 10/23/2020  Patient Name: Dustin Lopez  Medical Record Number: [id number]    DIAGNOSIS:    1. Anal adenocarcinoma (CMS-HCC)        STAGE:  cT4N1    TREATMENT PLAN:  Neoadjuvant chemoradiation.    SUBJECTIVE: Tolerated first fraction without incident.  Also started Xeloda today.  Mild nausea; compazine being refilled by Dr. Jomarie Longs.     PHYSICAL EXAM:  Vital Signs for this encounter:  Wt 74.4 kg (164 lb)  - BMI 23.53 kg/m??   General:  Alert and Oriented X 3.  No acute distress.    Skin: Deferred today.    ASSESSMENT: 180cGy of planned 5040cGy  Tolerating RT.     RECOMMENDATIONS:  1. Plan for Therapy: Continue treatment as planned  2. Skin/Dermatitis:  controlled  3. Pain: controlled    As part of weekly management, chart and films reviewed.     Benedetto Goad, MD  [facility] at [facility]

## 2020-10-23 NOTE — Unmapped (Signed)
Patient Education        Managing Side Effects of Radiation Therapy: Care Instructions  Your Care Instructions     Radiation is often used to treat cancer. Radiation therapy uses high-energy rays or radioactive material to kill cancer cells or keep them from growing. Radiation is very good at killing cancer cells. But it can also affect normal cells. This can lead to side effects.  The most common side effects of radiation therapy are feeling very tired and having sensitive skin in the treated area. Some people lose their appetite, feel sick to their stomach (nauseated), or have other problems. It depends on the area treated. For example, treating your lungs may lead to a cough. Radiation therapy for brain cancer can cause hair loss, but the hair usually grows back.  Most side effects will go away within a few weeks after you finish your therapy. Meanwhile, your doctor can give you medicine to ease some side effects. You also can do a lot at home to relieve symptoms. To feel as well as possible, get plenty of rest, drink plenty of fluids, and eat a healthy diet.  Follow-up care is a key part of your treatment and safety. Be sure to make and go to all appointments, and call your doctor if you are having problems. It's also a good idea to know your test results and keep a list of the medicines you take.  How can you care for yourself at home?  Medicines  ?? ?? Be safe with medicines. Take your medicines exactly as prescribed. Do not stop or change a medicine without talking to your doctor first.   ?? ?? Ask your doctor before taking any medicine, including natural health products and over-the-counter medicines.   Appetite problems and nausea  ?? ?? Try to eat a healthy, balanced diet. Foods that have protein and extra calories can help keep up your strength and prevent weight loss. If you don't feel like eating, drink liquid meal replacements for extra calories and protein.   ?? ?? Try to eat your main meal early.   ?? ?? Ask your doctor if you can take an over-the-counter medicine, such as dimenhydrinate (Dramamine) or meclizine (Antivert), to help with nausea.   ?? ?? If you are vomiting or have diarrhea:  ? Drink plenty of fluids to prevent dehydration. Choose water and other clear liquids. If you have kidney, heart, or liver disease and have to limit fluids, talk with your doctor before you increase the amount of fluids you drink.  ? When you are able to eat, try clear soups, mild foods, and liquids until all symptoms are gone for 12 to 48 hours. Other good choices include dry toast, crackers, cooked cereal, and gelatin dessert, such as Jell-O.   Rest and activity  ?? ?? Try to get some physical activity every day, but don't get too tired.   ?? ?? Get plenty of rest, and sleep as often as you feel the need. You may feel tired for several weeks.   ?? ?? Ask your family members and friends to help with daily errands and tasks.   ?? ?? Keep doing the hobbies you enjoy as your energy allows.   Managing stress  ?? ?? Consider joining a support group. Sharing your feelings with your spouse, a good friend, or other people with similar problems is a good way to reduce tension and stress.   ?? ?? Practice some relaxation techniques. Methods like deep breathing, muscle  relaxation, and meditation are all ways to lower your stress level.   ?? ?? Cry. Crying can relieve tension. It is part of the emotional healing process.   ?? ?? Express yourself through art. Try writing, crafts, dance, or art to relieve stress. Some dance, writing, or art groups may be available just for people who have cancer.   ?? ?? Be kind to your body and mind. Getting enough sleep, eating a healthy diet, and taking time to do things you enjoy can contribute to an overall feeling of balance in your life and help reduce stress.   ?? ?? Get help if you need it. Discuss your concerns with your doctor or counselor.   Mouth sores  ?? ?? If your mouth is sore and dry, sip cool water or suck on ice chips.   ?? ?? Eat soft, bland foods that are easy to swallow, such as applesauce, cottage cheese, soft-cooked eggs, and yogurt.   ?? ?? Avoid spicy and salty foods. And avoid coarse foods such as raw vegetables, dry crackers, and nuts.   ?? ?? If you had radiation to your mouth area, clean your mouth and teeth often, because you have a greater chance of getting cavities. Use an extra-soft toothbrush and a mild toothpaste.   ?? ?? Do not smoke. Smoking can make your symptoms worse. If you need help quitting, talk to your doctor.   ?? ?? Call the [employer] (7057068101) or visit its website at www.cancer.org for more information.   Skin care  ?? ?? Use lukewarm water for showers or quick baths. Pat yourself dry with a soft towel, being careful not to rub off any ink marks that are used for your radiation.   ?? ?? Be gentle with your skin in the treatment area. Wash skin gently, using only lukewarm water.   ?? ?? Avoid putting heating pads or cold packs or anything that is hot or cold on this skin.   ?? ?? Avoid rubbing or scratching the treated area, even if it is itchy.   ?? ?? Wear soft, loose fabrics.   ?? ?? Ask your doctor which soap and lotion products you can use.   ?? ?? Always wear sunscreen on exposed skin. Make sure to use a broad-spectrum sunscreen that has a sun protection factor (SPF) of 30 or higher. Use it every day, even when it is cloudy.   ?? ?? Avoid exposing the treated area to the sun.   When should you call for help?   Call 911 anytime you think you may need emergency care. For example, call if:  ?? ?? You passed out (lost consciousness).   Call your doctor now or seek immediate medical care if:  ?? ?? You have a fever.   ?? ?? You have abnormal bleeding.   ?? ?? You have new or worse pain.   ?? ?? You think you have an infection.   ?? ?? You have new symptoms, such as a cough, belly pain, vomiting, diarrhea, or a rash.   Watch closely for changes in your health, and be sure to contact your doctor if:  ?? ?? You are much more tired than usual.   ?? ?? You have swollen glands in your armpits, groin, or neck.   ?? ?? You do not get better as expected.   Where can you learn more?  Go to [facility] at [email or web address]/support/administrative officer in the Menu. Enter F105 in  the search box to learn more about Managing Side Effects of Radiation Therapy: Care Instructions.  Current as of: June 07, 2020??????????????????????????????Content Version: 13.1  ?? 2006-2021 [employer].   Care instructions adapted under license by [facility]. If you have questions about a medical condition or this instruction, always ask your healthcare professional. [employer] disclaims any warranty or liability for your use of this information.

## 2020-10-23 NOTE — Unmapped (Signed)
Pt seen today by Dr Ottis Stain for weekly progress check. Reviewed meds and allergies with pt. Pt with PO Xeloda with radiation. No RN in for progress check; no sensitive exam. 1st tx today.       10/23/20 1206   Gastrointestinal   Diarrhea Grade 0  (denies any issues with bowels)   Nausea Grade 1  (some nausea; no emesis)   Vomiting Grade 0   General Disorders and Administration Site Conditions   Fatigue Grade 0  (denies fatigue; 1st tx today)   Pain Grade 1  (no pain at this time; however, pt reported intermit L leg pain)   Renal and Urinary   Urinary Tract Pain Grade 0  (denies any issues with urination)

## 2020-10-24 ENCOUNTER — Telehealth
Admit: 2020-10-24 | Discharge: 2020-10-25 | Payer: MEDICARE | Attending: Cardiovascular Disease | Primary: Cardiovascular Disease

## 2020-10-24 DIAGNOSIS — C210 Malignant neoplasm of anus, unspecified: Principal | ICD-10-CM

## 2020-10-24 NOTE — Unmapped (Signed)
Last Visit:  09/11/2020 Dr. Jomarie Longs RTC in two weeks *pt needs to be scheduled*    Last Rx: 05/30/2020 30 tablets, 2 refills

## 2020-10-24 NOTE — Unmapped (Signed)
Last Potassium: 09/12/2020 6 tablets for K of 2.5    Last Ativan: 05/30/2020 30 tablets; 0 refills     Last visit: 09/11/2020 Dr. Jomarie Longs   *needs follow up*

## 2020-10-24 NOTE — Unmapped (Signed)
Left message to call and schedule appts

## 2020-10-24 NOTE — Unmapped (Unsigned)
Date of Encounter: 10/23/2020    Primary Care Provider:  Star Age, PA  Primary Cardiologist:  Arther Abbott, MD [facility]  Location of Services: Telephonic/Virtual during COVID-19      I have identified myself and conveyed my credentials to [facility].  I have explained the capabilities and limitations of telemedicine and the patient and I both agree that this is appropriate for their current circumstances/symptoms. Patient understands that their insurance company may be billed for this visit. I verified the patient's name, date of birth, and that Dustin Lopez is currently physically located in [place].  The patient {location:19197::was not located in a public space during this telemedicine visit,was located in a public space during this telemedicine visit and declined to move to a secured, private location and understands that private health information could be compromised due to their decision to stay in a public location.}  I documented and noted their call back number after confirming the patient's identity.  There were {people:[id number]::no additional persons present with the patient.,additional persons present with the patient whom the patient requested be present during this telemedicine visit. Name: ***, relationship: ***}.      Cardiology Assessment/Plan:        Cardiology Assessment/Plan:     # Rectal cancer.  The patient has rectal cancer and going under chemotherapy at this point.  He is followed closely by oncology.  He has thrombocytopenia and is chronic aspirin and anticoagulation.  If he has any bleeding then advised patient getting test with MS to his medication may need to be held or cut back.      #  Cardiomyopathy/heart failure.  The patient's Echo from 02/2020 showed mildly decreased, LVEF at 45%. Most recent echo on 10/17/20 showed LVEF > 55%.  Volume status appears stable after hydration at this point.  Low-salt diet encouraged.  He had heart failure episode earlier this year. Continue Lasix 20 mg prn for weight gain, leg edema and shortness of breath.        #  Permanent Atrial Fibrillation.  The patient is in atrial fibrillation today on exam.  EKG shows A. fib.  Rate is controlled. CHA2DS2-VASc Score/Risk Factors: 3 points, 3.2% (Stroke/TIA (2)  Age 13-74 (1) ). Continue metoprolol tartrate 100mg  BID for rate control. Patient is currently anticoagulated with Eliquis 5mg  BID and should continue for stroke prophylaxis. The patient denies bleeding issues with this. Will continue to monitor.    #  Cerebrovascular accident / Carotid Artery Stenosis. Patient has a history of CVA in 2016 and 2019. Carotid duplex on 09/17 showed 20-49% right internal carotid artery stenosis and  20-49% left internal carotid artery stenosis. This is stable from prior ultrasound. Patient denies recurrent TIA/CVA, unilateral weakness, slurred speech, facial droop or unilateral temporary blindness in an eye (amaurosis fugax). Will continue with Lipitor 40mg  nightly. Will continue to monitor.    #  Thoracic Aortic Aneurysm.  Patient's echo from 02/2020 showed mildly dilated ascending aorta 3.9cm. As the aneurysm is < 5 cm in diameter, surgical intervention is not indicated at this time.  The patient is a current smoker.  Will maintain good BP control and continue to monitor clinically.     #  Smoker.  The patient is a current smoker. The patient smokes 1 packs per day. I have discussed the benefits of quit smoking and have encouraged the patient to to reduce his smoking to 1/2 PPD. We will consider medication after his surgery.  F/u in Cardiology in *** months timeframe, sooner if needed.    Thank you Ms. Star Age, PA  for allowing me an opportunity to participate in patient's care.     Subjective:        HPI: Dustin Lopez (January 16, 1956) is a 66 y.o. male with a history of permanent A-fib, CVA, SOB, smoking, and thoracici aortic aneurysm who presents to [facility] heart and vascular for a follow-up. He was last seen on 01/25/2020.    Rectal cancer dx in 03/2020 and on chemotherapy now.  His skin received chemotherapy for roughly 6 months followed by radiation.    ***     Patient has been taking medicine regularly.  Patient does not eat much salt.   Lives with wife.   Patient is physically active.   Smoking Hx: current smoker about a pack per day.   Family Hx: paternal MI.     Cardiovascular History       CVA in 2016 and 2019 > Permanent A-fib since 2016.      Cardiac Testing:       Echocardiogram (10/17/20):   1. The left ventricular systolic function is normal, LVEF is visually estimated at > 55%.   2. The left ventricle is normal in size with normal wall thickness.   3. The right ventricle is mildly dilated in size, with normal systolic function.   4. Moderate biatrial enlargement.   5. Mildly dilated proximal ascending aorta, stable at 3.9 cm.   6. There is trivial mitral valve regurgitation.   7. There is mild tricuspid regurgitation.   8. There is no pulmonary hypertension, estimated pulmonary artery systolic pressure is 23 mmHg.       Echocardiogram (03/15/2020): Impression-   1. The left ventricular systolic function is mildly decreased, LVEF is  visually estimated at 45%.    2. The left ventricle is mildly dilated in size with normal wall thickness.    3. The left atrium is mildly dilated in size.    4. The right ventricle is normal in size, with normal systolic function.    5. There is mild to moderate mitral valve regurgitation.    6. There is mild tricuspid regurgitation.    7. There is moderate pulmonary hypertension, estimated pulmonary artery  systolic pressure is 60 mmHg.    8. The aorta at the sinuses of Valsalva 3.7cm and ascending aorta 3.9cm is mildly dilated.    9. IVC size and inspiratory change suggest mildly elevated right atrial  pressure. (5-10 mmHg).    EKG (01/25/20) : Impression-  A-Fib.     Echocardiogram (08/25/2017): Impression-   Low normal systolic LV function without focal wall motion abnormalities; EF 50-55%.  RV and atrial enlargement.  Aortic valve sclerosis with normal function.  Estimated PASP is 18 mmHg.  Aortic root dimension at upper limits of normal, 3.7 cm.  Mildly dilated proximal ascending aorta (tube), 3.8 cm.    PVL Carotid Duplex Bilateral (05/2016): Impression-    1. 20-49% right internal carotid artery stenosis, which appears stable when compared to previous study on 11/22/2015.    2. 20-49% left internal carotid artery stenosis. Thrombus with recanalized flow was seen on the previous study 11/22/2015. Thrombus    appears to have resolved. Mild to moderate calcified plaque was visualized on the anterior and posterior walls today.    3. Normal bilateral vertebral artery flow.    4. Mild to moderate heterogeneous calcified plaque is seen in the bilateral  carotid bifurcations.        Dr. Hermine Messick MD        (Electronically Signed)        Final Date: 11 June 2016 09:54    Medical History:        Past Medical History:   Diagnosis Date   ??? Abnormal ECG    ??? ARF (acute renal failure) (CMS-HCC) 03/17/2014   ??? Atrial fibrillation (CMS-HCC)    ??? Colon cancer (CMS-HCC)    ??? COPD (chronic obstructive pulmonary disease) (CMS-HCC)    ??? Crohn's disease (CMS-HCC)    ??? Hyperlipidemia    ??? Hypertension    ??? Rectal bleeding    ??? Seizures (CMS-HCC)    ??? Sepsis (CMS-HCC) 03/17/2014   ??? Stroke (CMS-HCC)     difficulty with numbers and time; no other deficits     Past Surgical History:   Procedure Laterality Date   ??? COLON SURGERY     ??? FRACTURE SURGERY     ??? PR CLOSE ENTEROSTOMY,RESEC+ANAST Midline 02/10/2015    Procedure: CLOSURE OF ENTEROSTOMY, LARGE OR SMALL INTESTINE; WITH RESECTION & ANASTOMOSIS, OTHER THAN COLORECTAL;  Surgeon: Daphane Shepherd, MD;  Location: MAIN OR [facility];  Service: Trauma   ??? PR COLONOSCOPY FLX DX W/COLLJ SPEC WHEN PFRMD N/A 05/25/2020    Procedure: COLONOSCOPY;  Surgeon: Ree Kida, MD;  Location: OR [place];  Service: Gastroenterology   ??? PR COLONOSCOPY THRU STOMA,LESN RMVL W/SNARE  11/10/2014    Procedure: COLONSCOPY-STOMA; W/REMOV TUMOR/POLYP/LES;  Surgeon: Liane Comber, MD;  Location: GI PROCEDURES [place] [facility];  Service: Gastroenterology   ??? PR DRESSING CHANGE,NOT FOR BURN N/A 03/21/2014    Procedure: DRESSING CHANGE (FOR OTHER THAN BURNS) UNDER ANESTHESIA (OTHER THAN LOCAL);  Surgeon: Daphane Shepherd, MD;  Location: MAIN [facility];  Service: Trauma   ??? PR I&D PERIANAL ABSCESS,SUPERFICIAL N/A 06/18/2017    Procedure: INCISION AND DRAINAGE, PERIANAL ABSCESS, SUPERFICIAL;  Surgeon: Raye Sorrow, MD;  Location: MAIN OR [place];  Service: Gastrointestinal   ??? PR I&D PERIRECTAL ABSCESS Bilateral 03/17/2014    Procedure: incision and drainage of perirectal and ischiorectal fossa abscess;  Surgeon: Bo Mcclintock, MD;  Location: MAIN [facility];  Service: Trauma   ??? PR I&D PERIRECTAL ABSCESS Bilateral 03/18/2014    Procedure: INCISION AND DRAINAGE OF ISCHIORECTAL AND/OR PERIRECTAL ABSCESS (SEPARATE PROCEDURE);  Surgeon: Lynnell Dike, MD;  Location: MAIN OR [facility];  Service: Trauma   ??? PR I&D PERIRECTAL ABSCESS Midline 03/19/2014    Procedure: INCISION AND DRAINAGE OF ISCHIORECTAL AND/OR PERIRECTAL ABSCESS (SEPARATE PROCEDURE);  Surgeon: Lynnell Dike, MD;  Location: MAIN OR [facility];  Service: Trauma   ??? PR I&D PERIRECTAL ABSCESS N/A 09/02/2014    Procedure: INCISION AND DRAINAGE OF ISCHIORECTAL AND/OR PERIRECTAL ABSCESS (SEPARATE PROCEDURE);  Surgeon: Daphane Shepherd, MD;  Location: MAIN OR [facility];  Service: Trauma   ??? PR I&D PERIRECTAL ABSCESS  11/28/2017    Procedure: Incision And Drainage Of Ischiorectal And/Or Perirectal Abscess (Separate Procedure);  Surgeon: Raye Sorrow, MD;  Location: MAIN OR [facility];  Service: Gastrointestinal   ??? PR INSERT TUNNELED CV CATH WITH PORT N/A 05/25/2020    Procedure: PORT PLACEMENT;  Surgeon: Ree Kida, MD;  Location: OR [place];  Service: Gastroenterology   ??? PR LAP, SURG COLOSTOMY N/A 03/21/2014 Procedure: LAPAROSCOPY, SURGICAL, COLOSTOMY OR SKIN LEVEL CECOSTOMY;  Surgeon: Daphane Shepherd, MD;  Location: MAIN OR [facility];  Service: Trauma   ??? PR NDSC EVAL INTSTINAL POUCH DX W/COLLJ SPEC SPX  11/10/2014    Procedure: ENDO EVAL SM INTEST POUCH; DX;  Surgeon: Liane Comber, MD;  Location: [facility] PROCEDURES [place] [facility];  Service: Gastroenterology   ??? PR PELVIC EXAMINATION W ANESTH Midline 03/17/2014    Procedure: exam under anesthesia;  Surgeon: Bo Mcclintock, MD;  Location: MAIN OR [facility];  Service: Trauma   ??? PR PELVIC EXAMINATION W ANESTH N/A 03/20/2014    Procedure: PELVIC EXAMINATION UNDER ANESTHESIA (OTHER THAN LOCAL);  Surgeon: Lynnell Dike, MD;  Location: MAIN OR [facility];  Service: Trauma   ??? PR PLACEMENT,SETON N/A 06/18/2017    Procedure: PLACEMENT OF SETON;  Surgeon: Raye Sorrow, MD;  Location: MAIN OR [facility];  Service: Gastrointestinal   ??? PR PLACEMENT,SETON N/A 11/28/2017    Procedure: PLACEMENT OF SETON;  Surgeon: Raye Sorrow, MD;  Location: MAIN OR [place];  Service: Gastrointestinal   ??? PR PLACEMENT,SETON N/A 04/18/2020    Procedure: RECTAL EXAM UNDER ANESTHESIA, SETON PLACEMENT;  Surgeon: Ree Kida, MD;  Location: OR [place];  Service: General Surgery   ??? PR REMOVAL ANAL FISTULA,INTERSPNINCTERIC Left 12/16/2014    Procedure: SURGICAL TREATMENT OF ANAL FISTULA (FISTULECTOMY/FISTULOTOMY); INTERSPHINCTERIC;  Surgeon: Daphane Shepherd, MD;  Location: MAIN OR [facility];  Service: Trauma   ??? PR REMOVAL ANAL FISTULA,SUBCUTANEOUS N/A 11/28/2017    Procedure: SURGICAL TREATMENT OF ANAL FISTULA (FISTULECTOMY/FISTULOTOMY); SUBCUTANEOUS;  Surgeon: Raye Sorrow, MD;  Location: MAIN OR [place];  Service: Gastrointestinal   ??? PR SURG DIAGNOSTIC EXAM, ANORECTAL N/A 03/21/2014    Procedure: ANORECTAL EXAM, SURGICAL, REQUIRING ANESTHESIA (GENERAL, SPINAL, OR EPIDURAL), DIAGNOSTIC;  Surgeon: Daphane Shepherd, MD;  Location: MAIN OR [facility];  Service: Trauma   ??? PR SURG DIAGNOSTIC EXAM, ANORECTAL N/A 06/18/2017    Procedure: ANORECTAL EXAM, SURGICAL, REQUIRING ANESTHESIA (GENERAL, SPINAL, OR EPIDURAL), DIAGNOSTIC;  Surgeon: Raye Sorrow, MD;  Location: MAIN OR [place];  Service: Gastrointestinal   ??? PR SURG DIAGNOSTIC EXAM, ANORECTAL N/A 11/28/2017    Procedure: ANORECTAL EXAM, SURGICAL, REQUIRING ANESTHESIA (GENERAL, SPINAL, OR EPIDURAL), DIAGNOSTIC;  Surgeon: Raye Sorrow, MD;  Location: MAIN OR [place];  Service: Gastrointestinal   ??? SMALL INTESTINE SURGERY         Social History:  Social History     Tobacco Use   Smoking Status Current Some Day Smoker   ??? Packs/day: 0.50   ??? Years: 45.00   ??? Pack years: 22.50   ??? Types: Cigarettes   ??? Last attempt to quit: 09/2016   ??? Years since quitting: 4.0   Smokeless Tobacco Never Used   Tobacco Comment    pt has decreased to 1/2 pk/day     Social History     Substance and Sexual Activity   Alcohol Use No     Social History     Substance and Sexual Activity   Drug Use No       Family History:  Family History   Problem Relation Age of Onset   ??? Heart attack Father    ??? Alcohol abuse Father    ??? COPD Father    ??? Heart disease Father    ??? Hypertension Father    ??? Seizures Other    ??? Hypertension Mother    ??? COPD Sister    ??? Hypertension Sister    ??? Arthritis Neg Hx    ??? Asthma Neg Hx    ??? Cancer Neg Hx    ??? Depression Neg Hx    ??? Diabetes Neg Hx    ??? Drug abuse Neg Hx    ???  Early death Neg Hx    ??? Hyperlipidemia Neg Hx    ??? Kidney disease Neg Hx    ??? Mental illness Neg Hx    ??? Mental retardation Neg Hx    ??? Hearing loss Neg Hx    ??? Miscarriages / Stillbirths Neg Hx    ??? Stroke Neg Hx    ??? Birth defects Neg Hx    ??? Vision loss Neg Hx    ??? Learning disabilities Neg Hx    ??? Lupus Neg Hx    ??? Angina Neg Hx    ??? Aneurysm Neg Hx    ??? Liver disease Neg Hx    ??? Clotting disorder Neg Hx    ??? Ulcers Neg Hx    ??? GER disease Neg Hx    ??? Thyroid disease Neg Hx    ??? Glaucoma Neg Hx        Other past medical history, medications, allergies and problem list reviewed in the medical record    Medication: Allergies: No Known Allergies    Current Outpatient Medications   Medication Sig Dispense Refill   ??? acetaminophen (TYLENOL) 325 MG tablet Take 2 tablets (650 mg total) by mouth Every six (6) hours. 24 tablet 0   ??? albuterol HFA 90 mcg/actuation inhaler Inhale 2 puffs every six (6) hours as needed for wheezing. 18 g 5   ??? apixaban (ELIQUIS) 5 mg Tab Take 1 tablet (5 mg total) by mouth two (2) times a day. 180 tablet 3   ??? aspirin 81 MG chewable tablet Chew 1 tablet (81 mg total) daily. (Patient taking differently: Chew 81 mg nightly. ) 90 tablet 3   ??? atorvastatin (LIPITOR) 40 MG tablet Take 1 tablet (40 mg total) by mouth nightly at bedtime. 90 tablet 3   ??? capecitabine (XELODA) 150 MG tablet Take 4 tablets (600 mg total) by mouth Two (2) times a day  with 1 other capecitabine prescription for 1,600 mg total. Take Monday through Friday during Radiation. 240 tablet 0   ??? capecitabine (XELODA) 500 MG tablet Take 2 tablets (1,000 mg total) by mouth Two (2) times a day  with 1 other capecitabine prescription for 1,600 mg total. Take Monday through Friday during Radiation. 120 tablet 0   ??? carBAMazepine (CARBATROL) 200 MG 12 hr capsule Take 2 capsule by mouth twice daily. 360 capsule 1   ??? fluticasone propion-salmeteroL (ADVAIR) 250-50 mcg/dose diskus Inhale 1 puff 2 (two) times a day. 180 each 3   ??? furosemide (LASIX) 20 MG tablet Take 1 tablet (20 mg total) by mouth daily as needed for swelling or other (for worsened sob). 30 tablet 5   ??? gabapentin (NEURONTIN) 600 MG tablet Take 1 tablet (600 mg total) by mouth Two (2) times a day. (Patient taking differently: Take 600 mg by mouth Two (2) times a day. Pt taking only at night) 180 tablet 3   ??? heparin, porcine, PF, 100 unit/mL Syrg Infuse 5 mL into a venous catheter every fourteen (14) days. For use while patient is on fluorouracil (5-FU) home infusion.   Flush IV catheter with heparin 5 mL as directed after final saline flush per SASH method and as needed for line maintenance. (Patient not taking: Reported on 10/02/2020) 4 each PRN   ??? ibuprofen (MOTRIN) 800 MG tablet Take 800 mg by mouth every six (6) hours as needed.     ??? lidocaine-prilocaine (EMLA) 2.5-2.5 % cream Apply to port 60 min prior to chemo infusion  5 g 3   ??? lisinopriL-hydrochlorothiazide (PRINZIDE,ZESTORETIC) 20-25 mg per tablet Take 1 tablet by mouth daily. 90 tablet 3   ??? loperamide (IMODIUM) 2 mg capsule Take 2 capsules to start then 1 capsule every 2 hours until diarrhea free for 12 hours. 60 capsule 2   ??? LORazepam (ATIVAN) 1 MG tablet Take 1 tablet (1 mg total) by mouth nightly as needed (insomnia). 30 tablet 0   ??? metoprolol tartrate (LOPRESSOR) 100 MG tablet Take 1 tablet (100 mg total) by mouth two (2) times a day. 180 tablet 3   ??? oxyCODONE (ROXICODONE) 5 MG immediate release tablet Take 1 tablet (5 mg total) by mouth every four (4) hours as needed for pain. 30 tablet 0   ??? prochlorperazine (COMPAZINE) 10 MG tablet Take 1 tablet (10 mg total) by mouth every six (6) hours as needed (Nausea/Vomiting). 30 tablet 2   ??? sodium chloride (NS) 0.9 % injection Infuse 10 mL into a venous catheter every fourteen (14) days. For use while patient is on fluorouracil (5-FU) home infusion.   Flush IV catheter with normal saline 10 mL prior to and after infusion followed by heparin flush per SASH method and as needed for line maintenance. (Patient not taking: Reported on 10/02/2020) 6 each PRN   ??? traZODone (DESYREL) 100 MG tablet Take 1 tablet (100 mg total) by mouth daily. 90 tablet 3     No current facility-administered medications for this visit.     Facility-Administered Medications Ordered in Other Visits   Medication Dose Route Frequency Provider Last Rate Last Admin   ??? albumin human 5 % bottle 25 g  25 g Intravenous Once Leia Alf, MD       ??? albumin human 5 % bottle 25 g  25 g Intravenous Once Leia Alf, MD           Review of Systems:      All positive and pertinent negatives are noted in the HPI; otherwise, all other systems are negative.      Constitution: Negative for chills, diaphoresis, fever, weakness, weight gain and weight loss.   HENT: Negative for headaches and sore throat.    Eyes: Negative for blurred vision and visual disturbance.   Cardiovascular: Negative for chest pain, claudication, dyspnea on exertion, irregular heartbeat, leg swelling, near-syncope, orthopnea, palpitations, paroxysmal nocturnal dyspnea and syncope.   Respiratory: Negative for cough, shortness of breath, snoring and wheezing.    Endocrine: Negative for cold intolerance and heat intolerance.   Skin: Negative for color change, dry skin, itching and rash.   Musculoskeletal: Negative for arthritis, back pain, joint pain, joint swelling and muscle cramps.   Gastrointestinal: Negative for abdominal pain, constipation, diarrhea, heartburn, hematochezia, melena, nausea and vomiting.   Genitourinary: Negative for hematuria.   Neurological: Negative for dizziness, light-headedness and numbness.   Allergic/Immunologic: Negative for environmental allergies.     Vitals:       BP Readings from Last 3 Encounters:   10/02/20 130/85   09/15/20 156/100   09/13/20 150/98      Wt Readings from Last 3 Encounters:   10/23/20 74.4 kg (164 lb)   10/02/20 73.8 kg (162 lb 12 oz)   09/12/20 74.8 kg (164 lb 14.5 oz)     There were no vitals taken for this visit.    Physical Examination:       No Physical Exam performed today due to telephone encounter in the setting of COVID-19 pandemic.  Labs:         Lab Results   Component Value Date    WBC 7.6 09/12/2020    HGB 11.0 (L) 09/12/2020    HCT 32.6 (L) 09/12/2020    PLT 159 09/12/2020       Lab Results   Component Value Date    NA 140 09/12/2020    K 2.5 (LL) 09/12/2020    CL 105 09/12/2020    CO2 24.8 09/12/2020    BUN 22 (H) 09/12/2020    CREATININE 1.48 (H) 09/12/2020    GLU 92 09/12/2020    CALCIUM 8.1 (L) 09/12/2020    MG 1.6 04/02/2015    PHOS 3.0 04/02/2015 Lab Results   Component Value Date    BILITOT 0.3 09/11/2020    BILIDIR 0.10 09/11/2020    PROT 7.7 09/11/2020    ALBUMIN 3.2 (L) 09/11/2020    ALT 15 09/11/2020    AST 17 09/11/2020    ALKPHOS 117 09/11/2020       Lab Results   Component Value Date    PT 13.8 (H) 03/12/2017    INR 1.18 03/12/2017    APTT 28.2 03/28/2015     I spent *** minutes on the {phone audio video visit:[id number]}. I spent an additional 10 minutes on pre- and post-visit activities.     The patient was physically located in [place] or a state in which I am permitted to provide care. The patient and/or parent/guardian understood that s/he may incur co-pays and cost sharing, and agreed to the telemedicine visit. The visit was reasonable and appropriate under the circumstances given the patient's presentation at the time.    The patient and/or parent/guardian has been advised of the potential risks and limitations of this mode of treatment (including, but not limited to, the absence of in-person examination) and has agreed to be treated using telemedicine. The patient's/patient's family's questions regarding telemedicine have been answered.     If the visit was completed in an ambulatory setting, the patient and/or parent/guardian has also been advised to contact their provider???s office for worsening conditions, and seek emergency medical treatment and/or call 911 if the patient deems either necessary.      Scribe Attestation:         This document serves as a record of the services and decisions performed by Arther Abbott, MD on 10/23/2020. It was created on his behalf by Beckie Salts, a trained medical scribe. The creation of this document is based on the provider's statements and observations that were conveyed to the medical scribe during the patient's encounter.    (The information in this document, created by the medical scribe for me, accurately reflects the services I personally performed and the decisions made by me. I have reviewed and approved this document for accuracy.)  Thank you for the opportunity to participate in Charlynne Cousins [facility] care.    Arther Abbott, MD, [facility]  [facility] and Vascular [facility])  Electronically signed 10/23/2020, 10:59 PM     Patient consented to and initiated the telephonic visit.  Place of Service: [phone]

## 2020-10-25 MED ORDER — LORAZEPAM 1 MG TABLET
ORAL_TABLET | Freq: Every evening | ORAL | 0 refills | 30 days | Status: CP | PRN
Start: 2020-10-25 — End: ?

## 2020-10-26 NOTE — Unmapped (Signed)
Call placed to patient to assess how he is doing with Xeloda.  Patient not home but spoke with his wife, Corrie Dandy, who stated that he is doing very well since starting the oral chemotherapy.  Assessed for s/s of HFS and wife denies any symptoms.  She states that he continues to have pain that runs down his leg but this has been a chronic issue.  No issues at this time.  They will call if any issues or concerns arise.

## 2020-10-30 DIAGNOSIS — C210 Malignant neoplasm of anus, unspecified: Principal | ICD-10-CM

## 2020-10-30 MED ORDER — PROCHLORPERAZINE MALEATE 10 MG TABLET
ORAL_TABLET | Freq: Four times a day (QID) | ORAL | 2 refills | 8.00000 days | Status: CP | PRN
Start: 2020-10-30 — End: ?
  Filled 2020-10-30: qty 30, 8d supply, fill #0

## 2020-10-30 NOTE — Unmapped (Signed)
RADIATION TREATMENT MANAGEMENT NOTE     Encounter Date: 10/30/2020  Patient Name: Dustin Lopez  Medical Record Number: [id number]    DIAGNOSIS:    1. Anal adenocarcinoma (CMS-HCC)        STAGE:  cT4N1    TREATMENT PLAN:  Neoadjuvant chemoradiation.    SUBJECTIVE:  Doing well.  Continued occasional perianal pain, sometimes wakes up with severe pain at night and uses oxycodone with relief.  Nausea affecting appetite.  Dr. Jomarie Longs refilled compazine but it was sent to [facility].  No bowel or bladder changes since start of RT.    PHYSICAL EXAM:  Vital Signs for this encounter:  Wt 71.1 kg (156 lb 12 oz)  - BMI 22.49 kg/m??   General:  Alert and Oriented X 3.  No acute distress.    Skin: Deferred today.    ASSESSMENT: 1080cGy of planned 5040cGy  Tolerating RT.     RECOMMENDATIONS:  1. Plan for Therapy: Continue treatment as planned  2. Skin/Dermatitis:  controlled  3. Pain: controlled   4. Nausea:  I sent compazine to his pharmacy in [place].    As part of weekly management, chart and films reviewed.     Benedetto Goad, MD  [facility] Radiation Oncology at [facility]

## 2020-10-30 NOTE — Unmapped (Signed)
Pt seen today by Dr Ottis Stain for weekly progress check. No RN assessment and no RN in for progress check; no sensitive exam.

## 2020-10-31 ENCOUNTER — Ambulatory Visit: Admit: 2020-10-31 | Discharge: 2020-11-27 | Payer: MEDICARE

## 2020-10-31 NOTE — Unmapped (Signed)
[facility] Clinical Assessment & Refill Coordination Note    Dustin Lopez, [place]: Feb 12, 1956  Phone: [phone] (home)     All above HIPAA information was verified with patient's family member, daughter [name].     Was a Nurse, learning disability used for this call? No    Specialty Medication(s):   Hematology/Oncology: Capecitabine 150 and 500mg , directions: 1600mg  2 times a day Mon-Fri     Current Outpatient Medications   Medication Sig Dispense Refill   ??? acetaminophen (TYLENOL) 325 MG tablet Take 2 tablets (650 mg total) by mouth Every six (6) hours. 24 tablet 0   ??? albuterol HFA 90 mcg/actuation inhaler Inhale 2 puffs every six (6) hours as needed for wheezing. 18 g 5   ??? apixaban (ELIQUIS) 5 mg Tab Take 1 tablet (5 mg total) by mouth two (2) times a day. 180 tablet 3   ??? aspirin 81 MG chewable tablet Chew 1 tablet (81 mg total) daily. (Patient taking differently: Chew 81 mg nightly. ) 90 tablet 3   ??? atorvastatin (LIPITOR) 40 MG tablet Take 1 tablet (40 mg total) by mouth nightly at bedtime. 90 tablet 3   ??? capecitabine (XELODA) 150 MG tablet Take 4 tablets (600 mg total) by mouth Two (2) times a day  with 1 other capecitabine prescription for 1,600 mg total. Take Monday through Friday during Radiation. 240 tablet 0   ??? capecitabine (XELODA) 500 MG tablet Take 2 tablets (1,000 mg total) by mouth Two (2) times a day  with 1 other capecitabine prescription for 1,600 mg total. Take Monday through Friday during Radiation. 120 tablet 0   ??? carBAMazepine (CARBATROL) 200 MG 12 hr capsule Take 2 capsule by mouth twice daily. 360 capsule 1   ??? fluticasone propion-salmeteroL (ADVAIR) 250-50 mcg/dose diskus Inhale 1 puff 2 (two) times a day. 180 each 3   ??? furosemide (LASIX) 20 MG tablet Take 1 tablet (20 mg total) by mouth daily as needed for swelling or other (for worsened sob). 30 tablet 5   ??? gabapentin (NEURONTIN) 600 MG tablet Take 1 tablet (600 mg total) by mouth Two (2) times a day. (Patient taking differently: Take 600 mg by mouth Two (2) times a day. Pt taking only at night) 180 tablet 3   ??? heparin, porcine, PF, 100 unit/mL Syrg Infuse 5 mL into a venous catheter every fourteen (14) days. For use while patient is on fluorouracil (5-FU) home infusion.   Flush IV catheter with heparin 5 mL as directed after final saline flush per SASH method and as needed for line maintenance. (Patient not taking: Reported on 10/02/2020) 4 each PRN   ??? ibuprofen (MOTRIN) 800 MG tablet Take 800 mg by mouth every six (6) hours as needed.     ??? KLOR-CON M20 20 mEq CR tablet TAKE 1 TABLET (20 MEQ TOTAL) BY MOUTH TWO (2) TIMES A DAY FOR 3 DAYS. 6 tablet 0   ??? lidocaine-prilocaine (EMLA) 2.5-2.5 % cream Apply to port 60 min prior to chemo infusion 5 g 3   ??? lisinopriL-hydrochlorothiazide (PRINZIDE,ZESTORETIC) 20-25 mg per tablet Take 1 tablet by mouth daily. 90 tablet 3   ??? loperamide (IMODIUM) 2 mg capsule Take 2 capsules to start then 1 capsule every 2 hours until diarrhea free for 12 hours. 60 capsule 2   ??? LORazepam (ATIVAN) 1 MG tablet TAKE 1 TABLET (1 MG TOTAL) BY MOUTH NIGHTLY AS NEEDED (INSOMNIA). 30 tablet 0   ??? metoprolol tartrate (LOPRESSOR) 100  MG tablet Take 1 tablet (100 mg total) by mouth two (2) times a day. 180 tablet 3   ??? oxyCODONE (ROXICODONE) 5 MG immediate release tablet Take 1 tablet (5 mg total) by mouth every four (4) hours as needed for pain. 30 tablet 0   ??? prochlorperazine (COMPAZINE) 10 MG tablet Take 1 tablet (10 mg total) by mouth every six (6) hours as needed for nausea or vomiting. 30 tablet 2   ??? sodium chloride (NS) 0.9 % injection Infuse 10 mL into a venous catheter every fourteen (14) days. For use while patient is on fluorouracil (5-FU) home infusion.   Flush IV catheter with normal saline 10 mL prior to and after infusion followed by heparin flush per SASH method and as needed for line maintenance. (Patient not taking: Reported on 10/02/2020) 6 each PRN   ??? traZODone (DESYREL) 100 MG tablet Take 1 tablet (100 mg total) by mouth daily. 90 tablet 3     No current facility-administered medications for this visit.     Facility-Administered Medications Ordered in Other Visits   Medication Dose Route Frequency Provider Last Rate Last Admin   ??? albumin human 5 % bottle 25 g  25 g Intravenous Once Leia Alf, MD       ??? albumin human 5 % bottle 25 g  25 g Intravenous Once Leia Alf, MD            Changes to medications: Dustin Lopez reports no changes at this time.    No Known Allergies    Changes to allergies: No    SPECIALTY MEDICATION ADHERENCE     Capecitabine 150 mg: 5 days of medicine on hand   Capeciatbine 500 mg: 5 days of medicine on hand     Medication Adherence    Patient reported X missed doses in the last month: 0  Specialty Medication: Capecitabine 150 and 500mg           Specialty medication(s) dose(s) confirmed: Regimen is correct and unchanged.     Are there any concerns with adherence? No    Adherence counseling provided? Not needed    CLINICAL MANAGEMENT AND INTERVENTION      Clinical Benefit Assessment:    Do you feel the medicine is effective or helping your condition? Yes    Clinical Benefit counseling provided? Not needed    Adverse Effects Assessment:    Are you experiencing any side effects? No    Are you experiencing difficulty administering your medicine? No    Quality of Life Assessment:    How many days over the past month did your condition/medication  keep you from your normal activities? For example, brushing your teeth or getting up in the morning. 0    Have you discussed this with your provider? Not needed    Therapy Appropriateness:    Is therapy appropriate? Yes, therapy is appropriate and should be continued    DISEASE/MEDICATION-SPECIFIC INFORMATION      N/A    PATIENT SPECIFIC NEEDS     - Does the patient have any physical, cognitive, or cultural barriers? No    - Is the patient high risk? Yes, patient is taking oral chemotherapy. Appropriateness of therapy as been assessed    - Does the patient require a Care Management Plan? No     - Does the patient require physician intervention or other additional services (i.e. nutrition, smoking cessation, social work)? No      SHIPPING  Specialty Medication(s) to be Shipped:   Hematology/Oncology: Capecitabine 150 and 500mg , directions: 1600mg  2 times a day Mon-Fri    Other medication(s) to be shipped: No additional medications requested for fill at this time     Changes to insurance: No    Delivery Scheduled: Yes, Expected medication delivery date: 11/01/20.     Medication will be delivered via Same Day Courier to the confirmed prescription address in [facility].    The patient will receive a drug information handout for each medication shipped and additional FDA Medication Guides as required.  Verified that patient has previously received a Conservation officer, historic buildings.    All of the patient's questions and concerns have been addressed.    Dustin Lopez   [facility] Specialty Pharmacist

## 2020-11-01 DIAGNOSIS — C20 Malignant neoplasm of rectum: Principal | ICD-10-CM

## 2020-11-01 DIAGNOSIS — I959 Hypotension, unspecified: Principal | ICD-10-CM

## 2020-11-01 MED FILL — CAPECITABINE 150 MG TABLET: ORAL | 10 days supply | Qty: 80 | Fill #1

## 2020-11-01 MED FILL — CAPECITABINE 500 MG TABLET: ORAL | 10 days supply | Qty: 40 | Fill #1

## 2020-11-02 ENCOUNTER — Ambulatory Visit: Admit: 2020-11-02 | Discharge: 2020-11-03 | Payer: MEDICARE

## 2020-11-02 DIAGNOSIS — I959 Hypotension, unspecified: Principal | ICD-10-CM

## 2020-11-02 DIAGNOSIS — C210 Malignant neoplasm of anus, unspecified: Principal | ICD-10-CM

## 2020-11-02 DIAGNOSIS — C20 Malignant neoplasm of rectum: Principal | ICD-10-CM

## 2020-11-02 LAB — CBC W/ AUTO DIFF
BASOPHILS ABSOLUTE COUNT: 0 10*9/L (ref 0.0–0.1)
BASOPHILS RELATIVE PERCENT: 0.4 %
EOSINOPHILS ABSOLUTE COUNT: 0.1 10*9/L (ref 0.0–0.7)
EOSINOPHILS RELATIVE PERCENT: 2 %
HEMATOCRIT: 38.2 % (ref 38.0–50.0)
HEMOGLOBIN: 13 g/dL — ABNORMAL LOW (ref 13.5–17.5)
LYMPHOCYTES ABSOLUTE COUNT: 1 10*9/L (ref 0.7–4.0)
LYMPHOCYTES RELATIVE PERCENT: 15.2 %
MEAN CORPUSCULAR HEMOGLOBIN CONC: 34 g/dL (ref 30.0–36.0)
MEAN CORPUSCULAR HEMOGLOBIN: 33.7 pg (ref 26.0–34.0)
MEAN CORPUSCULAR VOLUME: 99 fL — ABNORMAL HIGH (ref 81.0–95.0)
MEAN PLATELET VOLUME: 6.5 fL — ABNORMAL LOW (ref 7.0–10.0)
MONOCYTES ABSOLUTE COUNT: 0.3 10*9/L (ref 0.1–1.0)
MONOCYTES RELATIVE PERCENT: 5.1 %
NEUTROPHILS ABSOLUTE COUNT: 4.9 10*9/L (ref 1.7–7.7)
NEUTROPHILS RELATIVE PERCENT: 77.3 %
PLATELET COUNT: 323 10*9/L (ref 150–450)
RED BLOOD CELL COUNT: 3.85 10*12/L — ABNORMAL LOW (ref 4.32–5.72)
RED CELL DISTRIBUTION WIDTH: 15.8 % — ABNORMAL HIGH (ref 12.0–15.0)
WBC ADJUSTED: 6.3 10*9/L (ref 3.5–10.5)

## 2020-11-02 LAB — COMPREHENSIVE METABOLIC PANEL
ALBUMIN: 3.3 g/dL — ABNORMAL LOW (ref 3.5–5.0)
ALKALINE PHOSPHATASE: 96 U/L (ref 46–116)
ALT (SGPT): 13 U/L (ref 12–78)
ANION GAP: 10 mmol/L (ref 3–11)
AST (SGOT): 17 U/L (ref 15–40)
BILIRUBIN TOTAL: 0.4 mg/dL (ref 0.2–1.0)
BLOOD UREA NITROGEN: 16 mg/dL (ref 8–20)
BUN / CREAT RATIO: 12
CALCIUM: 9.3 mg/dL (ref 8.5–10.1)
CHLORIDE: 100 mmol/L (ref 98–107)
CO2: 27.8 mmol/L (ref 21.0–32.0)
CREATININE: 1.37 mg/dL — ABNORMAL HIGH (ref 0.80–1.30)
EGFR CKD-EPI AA MALE: 62 mL/min/{1.73_m2}
EGFR CKD-EPI NON-AA MALE: 53 mL/min/{1.73_m2}
GLUCOSE RANDOM: 98 mg/dL (ref 70–179)
POTASSIUM: 3.6 mmol/L (ref 3.5–5.0)
PROTEIN TOTAL: 8.1 g/dL — ABNORMAL HIGH (ref 6.0–8.0)
SODIUM: 138 mmol/L (ref 135–145)

## 2020-11-02 LAB — CEA: CARCINOEMBRYONIC ANTIGEN: 6.2 ng/mL — ABNORMAL HIGH (ref 0.0–5.0)

## 2020-11-02 MED ORDER — PREGABALIN 50 MG CAPSULE
ORAL_CAPSULE | Freq: Three times a day (TID) | ORAL | 2 refills | 30 days | Status: CP
Start: 2020-11-02 — End: 2021-11-02

## 2020-11-02 MED ORDER — OXYCODONE 5 MG TABLET
ORAL_TABLET | ORAL | 0 refills | 5 days | Status: CP | PRN
Start: 2020-11-02 — End: ?

## 2020-11-02 NOTE — Unmapped (Signed)
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 Completed 8 cycles of neoadjuvant FOLFOX.   ?? Mixed response on MRI pelvis after Cycle 6. Planned to restage After CRT  ?? Initiated  Neoadjuvant CRT with Xeloda on 10/23/20 - tolerating well.        PLAN:  1. Continue Xeloda 1600 mg BID M-F with RT. No dose limiting toxicities.   2. Has compazine 10 mg q6h prn for mild nausea.     #Peripheral neuropathy  -- now seems to have been exacerbated by oxaliplatin. Grade 2   -- pain improved with oxycodone. Not with Gabapentin.   -- recommend stopping Gabapentin 600 mg BID and Starting Lyrica 50 mg TID  -- we will gradually uptitrate lyrica to see if this helps.       # Remote ischemic stroke  -- residual right arm weakness and some mild expressive aphasia.   -- asa daily    # Hypertension  --controlled today.     # COPD  -- on Advair BID      # Afib  -- ELiquis 5 mg BID.     # Seizure disorder  -- on carbamazepine    # tobacco abuse  -- has tried multiple times to quit. Willing to consider this in the future.   --Did not discuss today    # CKD.  --Baseline creatinine 1.3-1.5.  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 Anal adenocarcinoma       Interval History:  Patient was started on chemoradiation with Xeloda on 10/23/2020.  Since completion of FOLFOX he has been experiencing intermittent and occasionally severe pain in his right leg/foot consistent with neuropathy.  He is also experiencing some tingling in his left foot which was not previously present. He has a history of mild neuropathy in his left foot after a foot injury for which she was on gabapentin.  The pain sometimes wakes him up at night and he is requiring about 2-3 oxycodone 5 mg tablets a day for pain control.  He is also been on gabapentin 600 mg 3 times daily for many years however this is not helped his current symptoms. Denies having rectal pain or pain with defecation. No diarrhea, constipation, rectal bleeding, melena.  Have occasional nausea for which he is using compazine. Has not had to use compazine for the last 3 days.       Oncology History Overview Note   DIAGNOSIS:  1.  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 fib on Eliquis, longstanding history of recurring ischial rectal abscesses initially diagnosed in 2015.  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  In 2018 underwent placement of multiple setons.  --March 2019 underwent removal of previous setons and placement of additional setons in bilateral subcutaneous fistulous as well as incision and drainage of  subcutaneous abscesses and subcutaneous fistulotomy marsupialization of wound.  --04/18/2020 underwent rectal EUA, fistulotomy's and incision and drainage of rectal abscess and biopsy of the anorectum.  Biopsy of the anal lesion was positive for invasive moderately differentiated mucinous adenocarcinoma  -- 05/09/2020 MRI pelvis: Extensive mucinous neoplasm centered in the intersphincteric space along the entire length of the anal canal extending to the distal perirectal region.  --Plan for total neoadjuvant therapy with neoadjuvant FOLFOX followed by neoadjuvant chemoradiation.  We will  --05/30/2020???09/13/2020 FOLFOX cycle 1???8. Oxaliplatin dose reduced to 70 mg/m?? cycle 5???8 for grade 1 PN  -- Interval restaging imaging was delayed due to scheduling issues and was scheduled ultimately after cycle 6.  -- 08/21/2020 MRI pelvis: Overall improvement  with mild interval reduction in the size of the larger bilateral superior perianal/perirectal masses.  However there is mild interval enlargement of the inferior portion of the left perianal/ischio anal mass.  Suggesting a mixed response.  Otherwise other imaging features are not substantially changed.  MRI abdomen was unremarkable.  CT chest showed NED  -- Plan is for restaging scans following completion of chemoradiation.  -- Chemoradiation with Xeloda initiated on 10/23/2020      Anal adenocarcinoma (CMS-HCC)   05/16/2020 Initial Diagnosis    Anal adenocarcinoma (CMS-HCC)     05/29/2020 - 09/12/2020 Chemotherapy    OP GI FOLFOX (ADJUVANT) (PALO)  OXALIplatin 85 mg/m2 IV on Day 1  Leucovorin 400 mg/m2 IV on Day 1  Fluorouracil 400 mg/m2 IV push, 2,400 mg/m2 CIVI over 46 hours on Day 1  14-day cycles                 Past Medical History:    Past Medical History:   Diagnosis Date   ??? Abnormal ECG    ??? ARF (acute renal failure) (CMS-HCC) 03/17/2014   ??? Atrial fibrillation (CMS-HCC)    ??? Colon cancer (CMS-HCC)    ??? COPD (chronic obstructive pulmonary disease) (CMS-HCC)    ??? Crohn's disease (CMS-HCC)    ??? Hyperlipidemia    ??? Hypertension    ??? Rectal bleeding    ??? Seizures (CMS-HCC)    ??? Sepsis (CMS-HCC) 03/17/2014   ??? Stroke (CMS-HCC)     difficulty with numbers and time; no other deficits       Past Surgical History:      He has a past surgical history that includes pr i&d perirectal abscess (Bilateral, 03/17/2014); pr pelvic examination w anesth (Midline, 03/17/2014); pr i&d perirectal abscess (Bilateral, 03/18/2014); pr i&d perirectal abscess (Midline, 03/19/2014); pr pelvic examination w anesth (N/A, 03/20/2014); pr lap, surg colostomy (N/A, 03/21/2014); pr surg diagnostic exam, anorectal (N/A, 03/21/2014); pr dressing change,not for burn (N/A, 03/21/2014); pr i&d perirectal abscess (N/A, 09/02/2014); Colon surgery; pr 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 spx (11/10/2014); pr colonoscopy thru stoma,lesn rmvl w/snare (11/10/2014); pr removal anal fistula,interspnincteric (Left, 12/16/2014); pr close enterostomy,resec+anast (Midline, 02/10/2015); pr surg diagnostic exam, anorectal (N/A, 06/18/2017); pr i&d perianal abscess,superficial (N/A, 06/18/2017); pr placement,seton (N/A, 06/18/2017); pr surg diagnostic exam, anorectal (N/A, 11/28/2017); pr placement,seton (N/A, 11/28/2017); pr removal anal fistula,subcutaneous (N/A, 11/28/2017); pr i&d perirectal abscess (11/28/2017); Fracture surgery; Small intestine surgery; pr placement,seton (N/A, 04/18/2020); pr insert tunneled cv cath with port (N/A, 05/25/2020); and pr colonoscopy flx dx w/collj spec when pfrmd (N/A, 05/25/2020).      Allergies:     He has No Known Allergies.        Medications:     He has a current medication list  which includes the following prescription(s): acetaminophen, albuterol, apixaban, aspirin, atorvastatin, capecitabine, capecitabine, carbamazepine, fluticasone propion-salmeterol, furosemide, heparin, porcine (pf), ibuprofen, klor-con m20, lidocaine-prilocaine, lisinopril-hydrochlorothiazide, loperamide, lorazepam, metoprolol tartrate, oxycodone, prochlorperazine, sodium chloride, trazodone, and pregabalin, and the following Facility-Administered Medications: albumin human and albumin human.      Family History:  No family history of cancer.     Social History:   Self employed as a Nutritional therapist. Lives with his daughter. Smokes 1PPD x 50 years.       Review of Systems:  As per HPI.  All other systems reviewed and negative.      Physical Exam:    Vitals: BP 136/86  - Pulse 72  - Temp 36.6 ??C (97.8 ??F) (Temporal)  - Resp 16  - Ht 177.8 cm (5' 10)  - Wt 72.1 kg (159 lb)  - SpO2 100%  - BMI 22.81 kg/m??   General:  Pleasant, no acute distress.    ECOG 1  Pain:  Pain Evaluation:                                0-10  Pain Score (0 - 1):                             4  Pain Location:                                   Leg (left leg)   Eyes: sclerae anicteric, conjunctivae pink. HENT:  Atraumatic.   Neck:  Supple  Lymphatics:  No palpable cervical, supraclavicular LAN.  Cardiovascular:  RRR,  No LE edema x2.  Good pulses bilateral lower extremities.  No evidence of ischemia to the foot. Color is normal.   Respiratory:  CTAB. Unlabored.     Gastrointestinal:  Soft and nontender  Skin:  No rashes or subcutaneous nodules.  Port left chest wall appears fine.   Musculoskeletal:  No bony pain or tenderness. Nl gait.      Psychiatric:  Affect appropriate. Judgment and insight nl.  Neurologic:  Alert and oriented x3. Grossly non-focal.      Rectal: Deferred    Pertinent Studies:    Labs:    Results for orders placed or performed in visit on 11/02/20   Comprehensive Metabolic Panel   Result Value Ref Range    Sodium 138 135 - 145 mmol/L    Potassium 3.6 3.5 - 5.0 mmol/L    Chloride 100 98 - 107 mmol/L    Anion Gap 10 3 - 11 mmol/L    CO2 27.8 21.0 - 32.0 mmol/L    BUN 16 8 - 20 mg/dL    Creatinine 2.95 (H) 0.80 - 1.30 mg/dL    BUN/Creatinine Ratio 12     EGFR CKD-EPI Non-African American, Male 53 mL/min/1.63m2    EGFR CKD-EPI African American, Male 62 mL/min/1.32m2    Glucose 98 70 - 179 mg/dL    Calcium 9.3 8.5 - 62.1 mg/dL    Albumin 3.3 (L) 3.5 - 5.0 g/dL    Total Protein 8.1 (H) 6.0 - 8.0 g/dL    Total Bilirubin 0.4 0.2 - 1.0 mg/dL    AST 17 15 - 40 U/L    ALT 13 12 - 78 U/L    Alkaline Phosphatase 96 46 - 116 U/L  CBC w/ Differential   Result Value Ref Range    WBC 6.3 3.5 - 10.5 10*9/L    RBC 3.85 (L) 4.32 - 5.72 10*12/L    HGB 13.0 (L) 13.5 - 17.5 g/dL    HCT 56.3 87.5 - 64.3 %    MCV 99.0 (H) 81.0 - 95.0 fL    MCH 33.7 26.0 - 34.0 pg    MCHC 34.0 30.0 - 36.0 g/dL    RDW 32.9 (H) 51.8 - 15.0 %    MPV 6.5 (L) 7.0 - 10.0 fL    Platelet 323 150 - 450 10*9/L    Neutrophils % 77.3 %    Lymphocytes % 15.2 %    Monocytes % 5.1 %    Eosinophils % 2.0 %    Basophils % 0.4 %    Absolute Neutrophils 4.9 1.7 - 7.7 10*9/L    Absolute Lymphocytes 1.0 0.7 - 4.0 10*9/L    Absolute Monocytes 0.3 0.1 - 1.0 10*9/L    Absolute Eosinophils 0.1 0.0 - 0.7 10*9/L    Absolute Basophils 0.0 0.0 - 0.1 10*9/L       I have independently reviewed notes from prior visits, results from labs and imaging.  The current plan of care requires intensive monitoring for toxicity due to use of antineoplastic treatments.  Medical decision making was of high complexity due to to ongoing risk of morbidity/mortality from chemo treatment.      The patient reports that all of his questions were answered to his satisfaction today.  He was encouraged to contact us for any questions that may arise prior to his next visit.      Thank you very much for allowing me to participate in his care.      Elyse Hsu, MD  [facility]

## 2020-11-02 NOTE — Unmapped (Signed)
Abstraction Result Flowsheet Data    This patient's last AWV date: [facility] Last Medicare Wellness Visit Date: Not Found  This patients last WCC/CPE date: : Not Found      Reason for Encounter  Reason for Encounter: Outreach  Primary Reason for Call: AWV  Outreach Call Outcome: Left message  Text Message: No

## 2020-11-06 DIAGNOSIS — C210 Malignant neoplasm of anus, unspecified: Principal | ICD-10-CM

## 2020-11-06 NOTE — Unmapped (Signed)
RADIATION TREATMENT MANAGEMENT NOTE     Encounter Date: 11/06/2020  Patient Name: Dustin Lopez  Medical Record Number: [id number]    DIAGNOSIS:    1. Anal adenocarcinoma (CMS-HCC)        STAGE:  cT4N1    TREATMENT PLAN:  Neoadjuvant chemoradiation.    SUBJECTIVE:  Noticing more fatigue towards the end of the week.  Nausea also tends to worsen as the week progresses; using compazine with good effect, no vomiting.  Denies bowel changes.  No significant urinary changes.      PHYSICAL EXAM:  Vital Signs for this encounter:  Wt 69.7 kg (153 lb 12 oz)  - BMI 22.06 kg/m??   General:  Alert and Oriented X 3.  No acute distress.    Skin: Mild erythema and hyperpigmentation perianal skin; persistent fistula.    ASSESSMENT: 1980cGy of planned 5040cGy  Tolerating RT.     RECOMMENDATIONS:  1. Plan for Therapy: Continue treatment as planned  2. Skin/Dermatitis:  controlled  3. Pain: controlled   4. Nausea:  Cont prn compazine.  5. Nutrition:  Discussed importance of adequate protein calore nutrition, suggested boost supplementation x 5 cans daily.    As part of weekly management, chart and films reviewed.     Benedetto Goad, MD  [facility] Radiation Oncology at [facility]

## 2020-11-06 NOTE — Unmapped (Signed)
Patient Education        Managing Side Effects of Radiation Therapy: Care Instructions  Your Care Instructions     Radiation is often used to treat cancer. Radiation therapy uses high-energy rays or radioactive material to kill cancer cells or keep them from growing. Radiation is very good at killing cancer cells. But it can also affect normal cells. This can lead to side effects.  The most common side effects of radiation therapy are feeling very tired and having sensitive skin in the treated area. Some people lose their appetite, feel sick to their stomach (nauseated), or have other problems. It depends on the area treated. For example, treating your lungs may lead to a cough. Radiation therapy for brain cancer can cause hair loss, but the hair usually grows back.  Most side effects will go away within a few weeks after you finish your therapy. Meanwhile, your doctor can give you medicine to ease some side effects. You also can do a lot at home to relieve symptoms. To feel as well as possible, get plenty of rest, drink plenty of fluids, and eat a healthy diet.  Follow-up care is a key part of your treatment and safety. Be sure to make and go to all appointments, and call your doctor if you are having problems. It's also a good idea to know your test results and keep a list of the medicines you take.  How can you care for yourself at home?  Medicines  ?? ?? Be safe with medicines. Take your medicines exactly as prescribed. Do not stop or change a medicine without talking to your doctor first.   ?? ?? Ask your doctor before taking any medicine, including natural health products and over-the-counter medicines.   Appetite problems and nausea  ?? ?? Try to eat a healthy, balanced diet. Foods that have protein and extra calories can help keep up your strength and prevent weight loss. If you don't feel like eating, drink liquid meal replacements for extra calories and protein.   ?? ?? Try to eat your main meal early.   ?? ?? Ask your doctor if you can take an over-the-counter medicine, such as dimenhydrinate (Dramamine) or meclizine (Antivert), to help with nausea.   ?? ?? If you are vomiting or have diarrhea:  ? Drink plenty of fluids to prevent dehydration. Choose water and other clear liquids. If you have kidney, heart, or liver disease and have to limit fluids, talk with your doctor before you increase the amount of fluids you drink.  ? When you are able to eat, try clear soups, mild foods, and liquids until all symptoms are gone for 12 to 48 hours. Other good choices include dry toast, crackers, cooked cereal, and gelatin dessert, such as Jell-O.   Rest and activity  ?? ?? Try to get some physical activity every day, but don't get too tired.   ?? ?? Get plenty of rest, and sleep as often as you feel the need. You may feel tired for several weeks.   ?? ?? Ask your family members and friends to help with daily errands and tasks.   ?? ?? Keep doing the hobbies you enjoy as your energy allows.   Managing stress  ?? ?? Consider joining a support group. Sharing your feelings with your spouse, a good friend, or other people with similar problems is a good way to reduce tension and stress.   ?? ?? Practice some relaxation techniques. Methods like deep breathing, muscle  relaxation, and meditation are all ways to lower your stress level.   ?? ?? Cry. Crying can relieve tension. It is part of the emotional healing process.   ?? ?? Express yourself through art. Try writing, crafts, dance, or art to relieve stress. Some dance, writing, or art groups may be available just for people who have cancer.   ?? ?? Be kind to your body and mind. Getting enough sleep, eating a healthy diet, and taking time to do things you enjoy can contribute to an overall feeling of balance in your life and help reduce stress.   ?? ?? Get help if you need it. Discuss your concerns with your doctor or counselor.   Mouth sores  ?? ?? If your mouth is sore and dry, sip cool water or suck on ice chips.   ?? ?? Eat soft, bland foods that are easy to swallow, such as applesauce, cottage cheese, soft-cooked eggs, and yogurt.   ?? ?? Avoid spicy and salty foods. And avoid coarse foods such as raw vegetables, dry crackers, and nuts.   ?? ?? If you had radiation to your mouth area, clean your mouth and teeth often, because you have a greater chance of getting cavities. Use an extra-soft toothbrush and a mild toothpaste.   ?? ?? Do not smoke. Smoking can make your symptoms worse. If you need help quitting, talk to your doctor.   ?? ?? Call the [employer] (7057068101) or visit its website at www.cancer.org for more information.   Skin care  ?? ?? Use lukewarm water for showers or quick baths. Pat yourself dry with a soft towel, being careful not to rub off any ink marks that are used for your radiation.   ?? ?? Be gentle with your skin in the treatment area. Wash skin gently, using only lukewarm water.   ?? ?? Avoid putting heating pads or cold packs or anything that is hot or cold on this skin.   ?? ?? Avoid rubbing or scratching the treated area, even if it is itchy.   ?? ?? Wear soft, loose fabrics.   ?? ?? Ask your doctor which soap and lotion products you can use.   ?? ?? Always wear sunscreen on exposed skin. Make sure to use a broad-spectrum sunscreen that has a sun protection factor (SPF) of 30 or higher. Use it every day, even when it is cloudy.   ?? ?? Avoid exposing the treated area to the sun.   When should you call for help?   Call 911 anytime you think you may need emergency care. For example, call if:  ?? ?? You passed out (lost consciousness).   Call your doctor now or seek immediate medical care if:  ?? ?? You have a fever.   ?? ?? You have abnormal bleeding.   ?? ?? You have new or worse pain.   ?? ?? You think you have an infection.   ?? ?? You have new symptoms, such as a cough, belly pain, vomiting, diarrhea, or a rash.   Watch closely for changes in your health, and be sure to contact your doctor if:  ?? ?? You are much more tired than usual.   ?? ?? You have swollen glands in your armpits, groin, or neck.   ?? ?? You do not get better as expected.   Where can you learn more?  Go to [facility] at [email or web address]/support/administrative officer in the Menu. Enter F105 in  the search box to learn more about Managing Side Effects of Radiation Therapy: Care Instructions.  Current as of: June 07, 2020??????????????????????????????Content Version: 13.1  ?? 2006-2021 [employer].   Care instructions adapted under license by [facility]. If you have questions about a medical condition or this instruction, always ask your healthcare professional. [employer] disclaims any warranty or liability for your use of this information.

## 2020-11-06 NOTE — Unmapped (Signed)
Pt seen today by Dr Ottis Stain for weekly progress check. Reviewed meds and allergies with pt. Pt with PO Xeloda with radiation. No RN in for progress check; no sensitive exam.       11/06/20 0925   Gastrointestinal   Diarrhea Grade 0  (denies any issues with bowels; no pain/discomfort or blood noted)   Nausea Grade 1  (pt notes nausea, no emesis, more towards end of week and weekend. pt has PRN antiemetics.)   Vomiting Grade 0   General Disorders and Administration Site Conditions   Fatigue Grade 1  (more fatigue and more towards end of week and into weekend)   Pain Grade 2  (denies pain at this time. by end of week and into weekend, pt reports bilat leg pain at 8-9)   Investigations   Weight Loss Grade 1  (3 lb downt his week. issues with appetite towards end of week; notes related with nausea)   Renal and Urinary   Urinary Tract Pain Grade 0  (nocturia varies. denies any issues with urination)   Skin and Subcutaneous Tissue   Rash Maculo-Papular Grade 0

## 2020-11-08 DIAGNOSIS — C210 Malignant neoplasm of anus, unspecified: Principal | ICD-10-CM

## 2020-11-09 ENCOUNTER — Ambulatory Visit: Admit: 2020-11-09 | Discharge: 2020-11-09 | Payer: MEDICARE

## 2020-11-09 DIAGNOSIS — C210 Malignant neoplasm of anus, unspecified: Principal | ICD-10-CM

## 2020-11-09 LAB — CEA: CARCINOEMBRYONIC ANTIGEN: 5.4 ng/mL — ABNORMAL HIGH (ref 0.0–5.0)

## 2020-11-09 LAB — COMPREHENSIVE METABOLIC PANEL
ALBUMIN: 3.1 g/dL — ABNORMAL LOW (ref 3.5–5.0)
ALKALINE PHOSPHATASE: 85 U/L (ref 46–116)
ALT (SGPT): 14 U/L (ref 12–78)
ANION GAP: 4 mmol/L (ref 3–11)
AST (SGOT): 15 U/L (ref 15–40)
BILIRUBIN TOTAL: 0.2 mg/dL (ref 0.2–1.0)
BLOOD UREA NITROGEN: 30 mg/dL — ABNORMAL HIGH (ref 8–20)
BUN / CREAT RATIO: 24
CALCIUM: 9.1 mg/dL (ref 8.5–10.1)
CHLORIDE: 106 mmol/L (ref 98–107)
CO2: 31.8 mmol/L (ref 21.0–32.0)
CREATININE: 1.23 mg/dL (ref 0.80–1.30)
EGFR CKD-EPI AA MALE: 70 mL/min/{1.73_m2}
EGFR CKD-EPI NON-AA MALE: 61 mL/min/{1.73_m2}
GLUCOSE RANDOM: 85 mg/dL (ref 70–179)
POTASSIUM: 3.9 mmol/L (ref 3.5–5.0)
PROTEIN TOTAL: 7.4 g/dL (ref 6.0–8.0)
SODIUM: 142 mmol/L (ref 135–145)

## 2020-11-09 LAB — CBC W/ AUTO DIFF
BASOPHILS ABSOLUTE COUNT: 0 10*9/L (ref 0.0–0.1)
BASOPHILS RELATIVE PERCENT: 0.4 %
EOSINOPHILS ABSOLUTE COUNT: 0.2 10*9/L (ref 0.0–0.7)
EOSINOPHILS RELATIVE PERCENT: 3.3 %
HEMATOCRIT: 36.7 % — ABNORMAL LOW (ref 38.0–50.0)
HEMOGLOBIN: 12.3 g/dL — ABNORMAL LOW (ref 13.5–17.5)
LYMPHOCYTES ABSOLUTE COUNT: 0.8 10*9/L (ref 0.7–4.0)
LYMPHOCYTES RELATIVE PERCENT: 12 %
MEAN CORPUSCULAR HEMOGLOBIN CONC: 33.6 g/dL (ref 30.0–36.0)
MEAN CORPUSCULAR HEMOGLOBIN: 33.6 pg (ref 26.0–34.0)
MEAN CORPUSCULAR VOLUME: 100.1 fL — ABNORMAL HIGH (ref 81.0–95.0)
MEAN PLATELET VOLUME: 6.9 fL — ABNORMAL LOW (ref 7.0–10.0)
MONOCYTES ABSOLUTE COUNT: 0.4 10*9/L (ref 0.1–1.0)
MONOCYTES RELATIVE PERCENT: 5.9 %
NEUTROPHILS ABSOLUTE COUNT: 5.4 10*9/L (ref 1.7–7.7)
NEUTROPHILS RELATIVE PERCENT: 78.4 %
PLATELET COUNT: 220 10*9/L (ref 150–450)
RED BLOOD CELL COUNT: 3.66 10*12/L — ABNORMAL LOW (ref 4.32–5.72)
RED CELL DISTRIBUTION WIDTH: 16.5 % — ABNORMAL HIGH (ref 12.0–15.0)
WBC ADJUSTED: 6.9 10*9/L (ref 3.5–10.5)

## 2020-11-10 NOTE — Unmapped (Signed)
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 Completed 8 cycles of neoadjuvant FOLFOX.   ?? Mixed response on MRI pelvis after Cycle 6. Planned to restage After CRT  ?? Initiated  Neoadjuvant CRT with Xeloda on 10/23/20 - tolerating well.        PLAN:  1. Continue Xeloda 1600 mg BID M-F with RT. No dose limiting toxicities.   2. Has compazine 10 mg q6h prn for mild nausea.   3.  Given recent increase in CEA to 6.2 from 3.8 in 08/2020 we will repeat CEA today.  If still elevated will restage with CT chest and MRI abdomen/pelvis with and without contrast to rule out metastatic disease.    # Hypertension  --Patient is on  lisinopril/hydrochlorothiazide.  --Blood pressure of pressures 96/69 today.  Repeat manual blood pressure taken by myself was 96/70.  Vitals are otherwise stable.  Afebrile.  No chills.  Mentating well.  Encouraged increased p.o. fluid intake and holding lisinopril/chlorthalidone until blood pressure normalizes.  --Patient remains adamant that blood pressure has fluctuated like this in the past although blood pressures have always been normal or elevated in our office.  --Encouraged to increase p.o. fluid intake and to repeat blood pressure today and tomorrow and to inform us of his blood pressures.  If still low then will need for IV fluids and reassessment.    #Peripheral neuropathy  -- now seems to have been exacerbated by oxaliplatin. Grade 2   -- pain improved with oxycodone.   --Has oxycodone 5mg  every 4-6 hours as needed for severe pain.  --Stopped the gabapentin.  --Started Lyrica 50 mg 3 times daily on 11/06/2020.  Patient reports improvement in pain since starting Lyrica.    # Remote ischemic stroke  -- residual right arm weakness and some mild expressive aphasia.   -- asa daily      # COPD  -- on Advair BID      # Afib  -- ELiquis 5 mg BID.     # Seizure disorder  -- on carbamazepine    # tobacco abuse  -- has tried multiple times to quit. Willing to consider this in the future.   --3 minutes spent in smoking cessation counseling today.   -- Referral made to smoking cessation counseling.  Patient was going to receive a call from counselors.    # CKD.  --Baseline creatinine 1.3-1.5.  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 Anal adenocarcinoma       Interval History:  Patient was started on chemoradiation with Xeloda on 10/23/2020.  Since completion of FOLFOX he has been experiencing intermittent and occasionally severe pain in his left leg/foot consistent with neuropathy.  He is also experiencing some tingling in his left foot which was not previously present.  He has a history of mild neuropathy in his left foot after a foot injury for which he  was on gabapentin.  The pain sometimes wakes him up at night and he is requiring about 2-3 oxycodone 5 mg tablets a day for pain control.      Last week we asked him to stop his gabapentin and start Lyrica 50 mg 3 times daily.  Patient reports that he did not start his Lyrica until Monday of this week.  He required pain medications this weekend due to the neuropathy in his left leg.  [place] and did not have his pain medications with him he had to borrow some Percocet from his wife's friend in order to they will the pain that he was experiencing.  He states that he started the Lyrica 50 mg 3 times daily on Monday of this week.  Since then he reports that he has had less pain in his left leg.  He had to take 5 mg oxycodone on Tuesday night but he has noticed that since he started the Lyrica the number of times he has had to use oxycodone significantly decreased.    He is tolerating Xeloda fairly well.  Denies rectal pain.  No pain with defecation.  No diarrhea constipation rectal bleeding or melena.     Of note last week his CEA had increased To 6.2 from 3.8 on 09/12/2020.  Repeat CEA is pending today.    His blood pressure today was 96/69.  He reports feeling well is mentating normally denies fevers chills.  On a repeat manual blood pressure assessment by myself his blood pressure was 96/70.  He states that he states that his blood pressure has fluctuated like this in the past.    Oncology History Overview Note   DIAGNOSIS:  1.  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 fib on Eliquis, longstanding history of recurring ischial rectal abscesses initially diagnosed in 2015.  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  In 2018 underwent placement of multiple setons.  --March 2019 underwent removal of previous setons and placement of additional setons in bilateral subcutaneous fistulous as well as incision and drainage of subcutaneous abscesses and subcutaneous fistulotomy marsupialization of wound.  --04/18/2020 underwent rectal EUA, fistulotomy's and incision and drainage of rectal abscess and biopsy of the anorectum.  Biopsy of the anal lesion was positive for invasive moderately differentiated mucinous adenocarcinoma  -- 05/09/2020 MRI pelvis: Extensive mucinous neoplasm centered in the intersphincteric space along the entire length of the anal canal extending to the distal perirectal region.  --Plan for total neoadjuvant therapy with neoadjuvant FOLFOX followed by neoadjuvant chemoradiation.  We will  --05/30/2020???09/13/2020 FOLFOX cycle 1???8. Oxaliplatin dose reduced to 70 mg/m?? cycle 5???8 for grade 1 PN  -- Interval restaging imaging was delayed due to scheduling issues and was scheduled ultimately after cycle 6.  -- 08/21/2020 MRI pelvis: Overall improvement  with mild interval reduction in the size of the larger bilateral superior perianal/perirectal masses.  However there is mild interval enlargement of the inferior portion of the left perianal/ischio anal mass.  Suggesting a mixed response.  Otherwise other imaging features are not substantially changed.  MRI abdomen was unremarkable.  CT chest showed NED  -- Plan is for restaging scans following completion of chemoradiation.  -- Chemoradiation with Xeloda initiated on 10/23/2020      Anal adenocarcinoma (CMS-HCC)   05/16/2020 Initial Diagnosis    Anal adenocarcinoma (CMS-HCC)     05/29/2020 -  09/12/2020 Chemotherapy    OP GI FOLFOX (ADJUVANT) (PALO)  OXALIplatin 85 mg/m2 IV on Day 1  Leucovorin 400 mg/m2 IV on Day 1  Fluorouracil 400 mg/m2 IV push, 2,400 mg/m2 CIVI over 46 hours on Day 1  14-day cycles                 Past Medical History:    Past Medical History:   Diagnosis Date   ??? Abnormal ECG    ??? ARF (acute renal failure) (CMS-HCC) 03/17/2014   ??? Atrial fibrillation (CMS-HCC)    ??? Colon cancer (CMS-HCC)    ??? COPD (chronic obstructive pulmonary disease) (CMS-HCC)    ??? Crohn's disease (CMS-HCC)    ??? Hyperlipidemia    ??? Hypertension    ??? Rectal bleeding    ??? Seizures (CMS-HCC)    ??? Sepsis (CMS-HCC) 03/17/2014   ??? Stroke (CMS-HCC)     difficulty with numbers and time; no other deficits       Past Surgical History:      He has a past surgical history that includes pr i&d perirectal abscess (Bilateral, 03/17/2014); pr pelvic examination w anesth (Midline, 03/17/2014); pr i&d perirectal abscess (Bilateral, 03/18/2014); pr i&d perirectal abscess (Midline, 03/19/2014); pr pelvic examination w anesth (N/A, 03/20/2014); pr lap, surg colostomy (N/A, 03/21/2014); pr surg diagnostic exam, anorectal (N/A, 03/21/2014); pr dressing change,not for burn (N/A, 03/21/2014); pr i&d perirectal abscess (N/A, 09/02/2014); Colon surgery; pr 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 spx (11/10/2014); pr colonoscopy thru stoma,lesn rmvl w/snare (11/10/2014); pr removal anal fistula,interspnincteric (Left, 12/16/2014); pr close enterostomy,resec+anast (Midline, 02/10/2015); pr surg diagnostic exam, anorectal (N/A, 06/18/2017); pr i&d perianal abscess,superficial (N/A, 06/18/2017); pr placement,seton (N/A, 06/18/2017); pr surg diagnostic exam, anorectal (N/A, 11/28/2017); pr placement,seton (N/A, 11/28/2017); pr removal anal fistula,subcutaneous (N/A, 11/28/2017); pr i&d perirectal abscess (11/28/2017); Fracture surgery; Small intestine surgery; pr placement,seton (N/A, 04/18/2020); pr insert tunneled cv cath with port (N/A, 05/25/2020); and pr colonoscopy flx dx w/collj spec when pfrmd (N/A, 05/25/2020).      Allergies:     He has No Known Allergies.        Medications:     He has a current medication list which includes the following prescription(s): acetaminophen, albuterol, apixaban, aspirin, atorvastatin, capecitabine, capecitabine, carbamazepine, fluticasone propion-salmeterol, furosemide, heparin, porcine (pf), ibuprofen, klor-con m20, lidocaine-prilocaine, lisinopril-hydrochlorothiazide, loperamide, lorazepam, metoprolol tartrate, oxycodone, pregabalin, prochlorperazine, sodium chloride, and trazodone, and the following Facility-Administered Medications: albumin human and albumin human.      Family History:  No family history of cancer.     Social History:   Self employed as a Nutritional therapist. Lives with his daughter. Smokes 1PPD x 50 years.       Review of Systems:  As per HPI.  All other systems reviewed and negative.      Physical Exam:    Vitals: BP 96/69  - Pulse 72  - Temp 36.5 ??C (97.7 ??F) (Temporal)  - Resp 16  - Ht 177.8 cm (5' 10)  - Wt 72.6 kg (160 lb 1.6 oz)  - SpO2 100%  - BMI 22.97 kg/m??   General:  Pleasant, no acute distress.    ECOG 1  Pain:  Pain Evaluation:                                0-10  Pain Score (0 - 1):  0  Pain Location: Eyes: sclerae anicteric, conjunctivae pink.    HENT:  Atraumatic.   Neck:  Supple  Lymphatics:  No palpable cervical, supraclavicular LAN.  Cardiovascular:  RRR,  No LE edema x2.  Good pulses bilateral lower extremities.  No evidence of ischemia to the foot. Color is normal.   Respiratory:  CTAB. Unlabored.     Gastrointestinal:  Soft and nontender  Skin:  No rashes or subcutaneous nodules.  Port left chest wall appears fine.   Musculoskeletal:  No bony pain or tenderness. Nl gait.      Psychiatric:  Affect appropriate. Judgment and insight nl.  Neurologic:  Alert and oriented x3. Grossly non-focal.      Rectal: Deferred    Pertinent Studies:    Labs:    Results for orders placed or performed in visit on 11/09/20   Comprehensive Metabolic Panel   Result Value Ref Range    Sodium 142 135 - 145 mmol/L    Potassium 3.9 3.5 - 5.0 mmol/L    Chloride 106 98 - 107 mmol/L    Anion Gap 4 3 - 11 mmol/L    CO2 31.8 21.0 - 32.0 mmol/L    BUN 30 (H) 8 - 20 mg/dL    Creatinine 1.61 0.96 - 1.30 mg/dL    BUN/Creatinine Ratio 24     EGFR CKD-EPI Non-African American, Male 61 mL/min/1.80m2    EGFR CKD-EPI African American, Male 70 mL/min/1.55m2    Glucose 85 70 - 179 mg/dL    Calcium 9.1 8.5 - 04.5 mg/dL    Albumin 3.1 (L) 3.5 - 5.0 g/dL    Total Protein 7.4 6.0 - 8.0 g/dL    Total Bilirubin 0.2 0.2 - 1.0 mg/dL    AST 15 15 - 40 U/L    ALT 14 12 - 78 U/L    Alkaline Phosphatase 85 46 - 116 U/L   CBC w/ Differential   Result Value Ref Range    WBC 6.9 3.5 - 10.5 10*9/L    RBC 3.66 (L) 4.32 - 5.72 10*12/L    HGB 12.3 (L) 13.5 - 17.5 g/dL    HCT 40.9 (L) 81.1 - 50.0 %    MCV 100.1 (H) 81.0 - 95.0 fL    MCH 33.6 26.0 - 34.0 pg    MCHC 33.6 30.0 - 36.0 g/dL    RDW 91.4 (H) 78.2 - 15.0 %    MPV 6.9 (L) 7.0 - 10.0 fL    Platelet 220 150 - 450 10*9/L    Neutrophils % 78.4 %    Lymphocytes % 12.0 %    Monocytes % 5.9 %    Eosinophils % 3.3 %    Basophils % 0.4 %    Absolute Neutrophils 5.4 1.7 - 7.7 10*9/L Absolute Lymphocytes 0.8 0.7 - 4.0 10*9/L    Absolute Monocytes 0.4 0.1 - 1.0 10*9/L    Absolute Eosinophils 0.2 0.0 - 0.7 10*9/L    Absolute Basophils 0.0 0.0 - 0.1 10*9/L       I have independently reviewed notes from prior visits, results from labs and imaging.  The current plan of care requires intensive monitoring for toxicity due to use of antineoplastic treatments.  Medical decision making was of high complexity due to to ongoing risk of morbidity/mortality from chemo treatment.      The patient reports that all of his questions were answered to his satisfaction today.  He was encouraged to contact us for any questions that may arise  prior to his next visit.      Thank you very much for allowing me to participate in his care.      Elyse Hsu, MD  [facility]

## 2020-11-15 DIAGNOSIS — C210 Malignant neoplasm of anus, unspecified: Principal | ICD-10-CM

## 2020-11-15 NOTE — Unmapped (Signed)
SKIN CARE DURING and AFTER RADIATION THERAPY  Most Common Skin Side Effects:  ??? Redness or darkening of the skin in the treatment area.  ??? Localized itching  ??? Dryness and peeling    Approximately 2-3 weeks after your first treatment, your skin in the treatment area may begin to get red, dark or irritated.  This may gradually worsen during your course of treatment.  These changes are an expected part of your therapy and are temporary.  The skin changes will usually begin to improve at about 2 weeks following completion of your treatment.  Some dryness and pigment changes may persist after radiation therapy.    The following instructions will help to minimize the skin reaction to the radiation:  ??? DO NOT remove the markings placed on your skin.  These are used by the therapists to help set-up your daily radiation treatments and must remain in place until your treatments are complete.  You may shower as long as you do not scrub off the marks.  ??? DO NOT use adhesive tape on skin within the treatment area.  If bandaging is necessary, please ask the nurse for assistance.  ??? DO NOT use make-up, perfumes, deodorant, rubbing alcohol, etc., on  the area being treated.  ??? Limit sun exposure on the area being treated.  ??? You may bathe or shower in warm, comfortable water.  DO NOT wash with hot water.  Pat dry your skin.  ??? Use a mild, unscented soap.  (Dove, Moisturizing Ivory, Basis, Neutrogena, etc.)  ??? DO NOT rub, scrub or scratch your skin.  ??? Only use skin creams and lotions as instructed by the nurse or doctor.  ??? DO NOT use a straight edge razor blade to shave the area being treated.  You may use an electric razor.  ??? DO NOT wear tight clothing over the area being treated.  It is best to wear loose-fitting cotton shirts and briefs; this allows the skin to breathe and stay dry.  ??? Limit swimming in chlorinated or salt water during your radiation treatments.  Rinse off with fresh water after swimming.    Treatment:  ??? If a moisturizing cream is prescribed or sample provided, then gently apply it over the treatment area using your finger tips.  You should apply the cream 1 or 2 times during the day and at bedtime.  DO NOT apply any creams on your skin for 3-4 hours before your radiation treatment.  ??? Some recommended creams include Aquaphor Healing Ointment, Eucerin, Vanicream, Nivea, Gold Bond Healing.  We prefer that you use hypoallergenic and fragrance free skin products.  ??? If an area starts to itch, then a 1% Hydrocortisone or Benadryl cream may be applied in that area.  Please inform your nurse.  ??? You will receive special skin care instructions if you develop any blistering, weeping or raw areas.    Please feel free to direct any questions or concerns to our staff.

## 2020-11-15 NOTE — Unmapped (Signed)
Dustin Lopez seen today by Dr Ottis Stain for weekly progress check. Reviewed meds and allergies with Dustin Lopez. Dustin Lopez with PO Xeloda with radiation. No RN in for progress check; chaperone offered.       11/15/20 0930   Gastrointestinal   Diarrhea Grade 0  (denies any issues with bowels)   Nausea Grade 0  (denies N/V)   Vomiting Grade 0   General Disorders and Administration Site Conditions   Fatigue Grade 2  (increased fatigue)   Pain Grade 1  (denies pain; however increased irritation in rectum with skin)   Renal and Urinary   Urinary Tract Pain Grade 0  (denies any issues with urination)   Skin and Subcutaneous Tissue   Rash Maculo-Papular Grade 2  (increased irriation with skin at anus. Dustin Lopez states tenderness and irritation. Dustin Lopez states hurts to wipe. instructions given for soaks in tub. Dustin Lopez using Aquaphor. reviewed skin care)

## 2020-11-15 NOTE — Unmapped (Signed)
RADIATION TREATMENT MANAGEMENT NOTE     Encounter Date: 11/15/2020  Patient Name: Martin Majestic  Medical Record Number: [id number]    DIAGNOSIS:    1. Anal adenocarcinoma (CMS-HCC)        STAGE:  cT4N1    TREATMENT PLAN:  Neoadjuvant chemoradiation.    SUBJECTIVE:  Most bothered by perianal skin irritation.  Keeping area clean by soaking in baths.  Denies diarrhea/loose stool.  Having BMs about every other day.  Denies urinary irritation.    PHYSICAL EXAM:  Vital Signs for this encounter:  There were no vitals taken for this visit.  General:  Alert and Oriented X 3.  No acute distress.    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  No moist desquamation    ASSESSMENT: 3240cGy of planned 5040cGy  Tolerating RT.     RECOMMENDATIONS:  1. Plan for Therapy: Continue treatment as planned  2. Skin/Dermatitis:  controlled.  Continue salt/soda bath soaks, aquaphor.  3. Pain: controlled   4. Nausea:  Cont prn compazine.      As part of weekly management, chart and films reviewed.     Benedetto Goad, MD  [facility] at [facility]

## 2020-11-16 ENCOUNTER — Other Ambulatory Visit: Admit: 2020-11-16 | Discharge: 2020-11-16 | Payer: MEDICARE

## 2020-11-16 ENCOUNTER — Institutional Professional Consult (permissible substitution): Admit: 2020-11-16 | Discharge: 2020-11-16 | Payer: MEDICARE

## 2020-11-16 DIAGNOSIS — C210 Malignant neoplasm of anus, unspecified: Principal | ICD-10-CM

## 2020-11-16 LAB — COMPREHENSIVE METABOLIC PANEL
ALBUMIN: 3.1 g/dL — ABNORMAL LOW (ref 3.5–5.0)
ALKALINE PHOSPHATASE: 90 U/L (ref 46–116)
ALT (SGPT): 13 U/L (ref 12–78)
ANION GAP: 3 mmol/L (ref 3–11)
AST (SGOT): 15 U/L (ref 15–40)
BILIRUBIN TOTAL: 0.3 mg/dL (ref 0.2–1.0)
BLOOD UREA NITROGEN: 24 mg/dL — ABNORMAL HIGH (ref 8–20)
BUN / CREAT RATIO: 21
CALCIUM: 9.6 mg/dL (ref 8.5–10.1)
CHLORIDE: 106 mmol/L (ref 98–107)
CO2: 31.9 mmol/L (ref 21.0–32.0)
CREATININE: 1.17 mg/dL (ref 0.80–1.30)
EGFR CKD-EPI AA MALE: 75 mL/min/{1.73_m2}
EGFR CKD-EPI NON-AA MALE: 65 mL/min/{1.73_m2}
GLUCOSE RANDOM: 94 mg/dL (ref 70–179)
POTASSIUM: 4.5 mmol/L (ref 3.5–5.0)
PROTEIN TOTAL: 7.5 g/dL (ref 6.0–8.0)
SODIUM: 141 mmol/L (ref 135–145)

## 2020-11-16 LAB — CBC W/ AUTO DIFF
BASOPHILS ABSOLUTE COUNT: 0 10*9/L (ref 0.0–0.1)
BASOPHILS RELATIVE PERCENT: 0.3 %
EOSINOPHILS ABSOLUTE COUNT: 0.3 10*9/L (ref 0.0–0.7)
EOSINOPHILS RELATIVE PERCENT: 3.8 %
HEMATOCRIT: 35.1 % — ABNORMAL LOW (ref 38.0–50.0)
HEMOGLOBIN: 11.8 g/dL — ABNORMAL LOW (ref 13.5–17.5)
LYMPHOCYTES ABSOLUTE COUNT: 0.4 10*9/L — ABNORMAL LOW (ref 0.7–4.0)
LYMPHOCYTES RELATIVE PERCENT: 5.8 %
MEAN CORPUSCULAR HEMOGLOBIN CONC: 33.7 g/dL (ref 30.0–36.0)
MEAN CORPUSCULAR HEMOGLOBIN: 33.8 pg (ref 26.0–34.0)
MEAN CORPUSCULAR VOLUME: 100.5 fL — ABNORMAL HIGH (ref 81.0–95.0)
MEAN PLATELET VOLUME: 6.9 fL — ABNORMAL LOW (ref 7.0–10.0)
MONOCYTES ABSOLUTE COUNT: 0.5 10*9/L (ref 0.1–1.0)
MONOCYTES RELATIVE PERCENT: 7.8 %
NEUTROPHILS ABSOLUTE COUNT: 5.4 10*9/L (ref 1.7–7.7)
NEUTROPHILS RELATIVE PERCENT: 82.3 %
PLATELET COUNT: 172 10*9/L (ref 150–450)
RED BLOOD CELL COUNT: 3.49 10*12/L — ABNORMAL LOW (ref 4.32–5.72)
RED CELL DISTRIBUTION WIDTH: 16.3 % — ABNORMAL HIGH (ref 12.0–15.0)
WBC ADJUSTED: 6.6 10*9/L (ref 3.5–10.5)

## 2020-11-16 LAB — CEA: CARCINOEMBRYONIC ANTIGEN: 5 ng/mL (ref 0.0–5.0)

## 2020-11-16 NOTE — Unmapped (Signed)
Patient to clinic for nurse visit.  Assessed for side effects of Xeloda and patient endorses having diarrhea for a couple days a week due to radiation and his oral chemo (Xeloda).  No HFS, nausea/vomiting, mucositis, cytopenias, or rash.  Patient states that his intake is decreased but that he has a great appetite on the days that he gets his steroid.  He is making sure that he is getting an Ensure everyday and believes this to be helping him to feel better.  Labs stable and reviewed with patient.  Pain assessed and he states that he is a 6-7/10 on numerical scale and has pain in my ass where I get zapped.  He continues to take Oxycodone 5 mg IR for pain and states that he took on about an hour ago and once it kicks in it will help with the pain.  States that he is only taking the Oxycodone 5 mg prn and takes it maybe once a day, if that.  Patient has been doing sitz baths and encouraged him to continue this, as needed, for comfort.  No other issues noted at this time.  Patient remains compliant with Xeloda administration during radiation.  He is scheduled to come to the clinic next week for labs/exam.

## 2020-11-16 NOTE — Unmapped (Signed)
Patient had vital signs-stable, provider notified patient in clinic, labs checked, met with educator Helmut Muster. No signs of distress.

## 2020-11-17 NOTE — Unmapped (Signed)
Dustin Lopez,     Please inform patient that his CEA has normalized to 5.0.  It was transiently increased but as long as it continues to remain low we will plan to perform restaging scans only after completion of chemoradiation.    Ernest Mallick

## 2020-11-21 MED ORDER — SILVER SULFADIAZINE 1 % TOPICAL CREAM
1 refills | 0.00000 days | Status: CP
Start: 2020-11-21 — End: 2021-11-21

## 2020-11-21 MED ORDER — METOPROLOL TARTRATE 100 MG TABLET
ORAL_TABLET | Freq: Two times a day (BID) | ORAL | 0 refills | 90.00000 days | Status: CP
Start: 2020-11-21 — End: ?
  Filled 2020-11-22: qty 180, 90d supply, fill #0

## 2020-11-21 NOTE — Unmapped (Signed)
Patient is requesting the following refill  Requested Prescriptions     Pending Prescriptions Disp Refills   ??? metoprolol tartrate (LOPRESSOR) 100 MG tablet 180 tablet 3     Sig: Take 1 tablet (100 mg total) by mouth two (2) times a day.       Last OV: 03/15/2020

## 2020-11-21 NOTE — Unmapped (Signed)
RADIATION TREATMENT MANAGEMENT NOTE     Encounter Date: 11/21/2020  Patient Name: Dustin Lopez  Medical Record Number: [id number]    DIAGNOSIS:    1. Anal adenocarcinoma (CMS-HCC)        STAGE:  cT4N1    TREATMENT PLAN:  Neoadjuvant chemoradiation.    SUBJECTIVE:  Skin more irritated. More diarrhea. Will hold treatment today. Add Silvadene for skin. Instructed in diet and Imodium. Dr. Ottis Stain will check tomorrow.     PHYSICAL EXAM:  Vital Signs for this encounter:  There were no vitals taken for this visit.  General:  Alert and Oriented X 3.  No acute distress.    Skin: Dusky erythema and hyperpigmentation of the perianal and inguinal skin. Moist areas in inguinal crease and perineum    ASSESSMENT: 3240cGy of planned 5040cGy  Tolerating RT.     RECOMMENDATIONS:  1. Plan for Therapy: Treatment break planned for 1 day- Dr. Ottis Stain to assess Wed  2. Skin/Dermatitis:  controlled.  Continue salt/soda bath soaks, add Silvadene  3. Pain: controlled   4. Nausea:  Cont prn compazine.  5. Diarrhea- diet and Imodium as discussed      As part of weekly management, chart and films reviewed.     Griselda Miner, MD  [facility] at [facility]

## 2020-11-21 NOTE — Unmapped (Signed)
Pt seen today by Dr Carney Bern on treatment table to assess skin. RT present for skin check; no RN present. Rx sent for Silvadene. Dr Carney Bern gave instructions. Soaks previously given. Pt placed on break and advised to return to clinic tomorrow for progress check with Dr Ottis Stain. Pt seen by RN for instructions with diarrhea as pt reported increased diarrhea since last progress check. No RN assessment (will assess with progress check with Dr Ottis Stain); however, education given for diarrhea. Advised not to take PO Xeloda. Strongly recommend to contact Dr Rodney Booze Onc to advise on status with possible labs/IVF. Pt scheduled for appt with med onc on 2/24. Pt will see Dr Ottis Stain tomorrow for progress check prior to any treatment.

## 2020-11-21 NOTE — Unmapped (Signed)
DIARRHEA, GAS OR CRAMPING    While receiving chemotherapy or radiation treatments, you may have irritation to the GI tract and you may develop diarrhea, gas or cramping.  Diarrhea is an increase of three (3) or more stools per day or an increase in liquidity of bowel movements. Diarrhea may be caused by emotional stress, certain medications like antibiotics or chemotherapy or a form of malabsorption. It is essential to drink an additional quart of water during diarrhea.    Diarrhea causes food to pass very quickly through the stomach and intestines so that the body absorbs fewer essential vitamins, minerals and water.  Eating a lower fiber diet will help reduce the number of soft stools, and additional protein will heal damaged tissue. It is important to continue eating and drinking in smaller amounts more often, which helps digestion and reduces abdominal pressure.  Keep a record of the number of bowel movements and times to assess and report to physician.    Watch for signs of dehydration  ??? Dryness of your skin and mouth  ??? Decreased volume or dark urine  ??? Increased tiredness  ??? Dizziness or feeling light-headed    Suggestions to Avoid or Reduce Diarrhea    ??? Eat 4-6 small meals daily, with healthy snacks and water between meals.   ??? Try warm instead of hot or cold foods.  Hot foods speed up the rate food travels through the stomach and intestines.  ??? Eat foods low in fiber to reduce stool volume.  These include chicken, eggs, white potatoes without skin, cooked cereals, white bread, white rice, yogurt, canned or well-cooked vegetables, applesauce, small bananas, rice cereal, rice cakes or crackers, saltine crackers, peanut butter and baked or grilled fish.  ??? Avoid foods that cause gas and cramping.  These may include beer, broccoli, cabbage, cauliflower, chewing gum, corn, dried beans and peas, fried foods, fruits, onions, raw vegetables, and spicy foods that contain hot peppers, curry, garlic or Cajun spices. ??? Limit or avoid carbonated sodas that release gas into stomach.  ??? Eat a low fat, low lactose diet and limit milk products to yogurt and cheese, if binding.  ??? Try to chew with your mouth closed.  Talking while you chew may cause you to swallow too much air that can lead to gas and cramping.  ??? Drink beverages 45 minutes to one hour before or after meals.    ??? Minimize activity for one hour after meals.  ??? Limit beverages that contain caffeine, like coffee, strong tea, colas or other sodas, and chocolate.      ??? Add nutmeg to food.  This spice may help to slow movement of your bowels.  ??? Avoid sorbitol and sugar-alcohol containing products like sugar free candies, syrup or gum.  ??? Avoid alcohol, which is a diuretic and a stimulant.   Severe Diarrhea    Severe diarrhea is defined as eight (8) or more loose, watery stools per day or diarrhea lasting for three (3) days. If you develop severe diarrhea, call your doctor.  S/he may prescribe an anti-diarrheal medication or make other suggestions to treat your diarrhea.  Also call your doctor if you           Feel weak or have signs of dehydration  Cannot eat or drink       See pus or blood in your bowel movements Have severe cramping or pain       Have an increase in frequency of diarrhea  Develop  a fever    Recovery Suggestions  ??? Eat and drink only clear liquids for one day.  Clear liquids include broth, weak tea,  decaffeinated coffee, gelatin, water, popsicles, pulp-free fruit punches, fruit drinks, and sugar free beverages like Crystal Light, and juice based supplements.  ??? Drink at least three quarts or 96 oz. of fluids each day.  Drinking more liquid does not cause more diarrhea. You must replace the extra fluid you are losing.  ??? Limit carbonation and let ginger ale go flat to limit the gas released.  ??? Once slowed or stopped, gradually add food low in fiber over the next several days. By adding one new food at the time, you can determine if a certain food causes diarrhea.  ??? Try eating grated raw peeled apple or applesauce every 2-4 hours as tolerated. The pectin in apples helps to control diarrhea.  ??? Foods that contain large amounts of potassium will help replace that lost in stools:  apricots, apricot or peach nectar, bananas, orange, grapefruit or tomato juice, potatoes, yogurt. Add fruits in gradually.        Avoid         Fried foods   Hot peppers   Whole grain breads        Rich desserts   Raw vegetables  Alcohol         Milk products              Raw fruits (except bananas & peeled apple)       Fatty foods like bacon, cold cuts, corned beef, frankfurters, pastrami, and sausage       High caffeine beverages: coffee, Mountain Dew, Mello Yellow, Dr. Reino Kent, colas     Comfort Measures  ??? Clean rectal area with warm water gently and thoroughly after each bowel movement; pat dry.    ??? Apply Desitin or A&D Ointment around the rectal area.   ??? Take a warm bath or a sitz bath.   ??? Use a heating pad or moist heat on your abdomen if experiencing abdominal cramping, but do NOT use a heating pad on an area to which you are receiving radiation.  As your nurse or physician if you have questions or are uncertain.                  DIARRHEA       1.  Replace lost fluids with clear liquids served cold or at room temperature.    2.  Avoid milk products and caffeine.    3.  Eat foods containing pectin ( bananas, avocados, beets, unspiced applesauce, and peeled apples)    4.  Eat foods low in fiber such as bananas, rice, applesauce, toast, mashed potatoes, eggs, fish, cottage cheese, and yogurt.    5.  Avoid foods which cause gas and are acidic, fatty or fried ( whole grain products, nuts. Seeds, popcorn, pickles, relishes, rich pastries, raw vegetables).    6.  Increase foods or liquids containing potassium such as apricots, peach nectar, bananas, mashed or baked potatoes, GatrorAde and PowerAde.    Imodium Instructions:    Day 1:  If you have three (loose or watery) diarrhea bowel movements in one day take two Imodium after each loose stool and one after the other loose stools but do NOT take more than eight Imodium tablets in 24 hours.    Day 2:  If your diarrhea has stopped, then STOP the Imodium.  If the diarrhea has not stopped  continue with day 1 instructions.    Day 3:  If your diarrhea is not resolving, please call the clinic.

## 2020-11-22 ENCOUNTER — Ambulatory Visit: Admit: 2020-11-22 | Discharge: 2020-11-22 | Payer: MEDICARE

## 2020-11-22 DIAGNOSIS — C210 Malignant neoplasm of anus, unspecified: Principal | ICD-10-CM

## 2020-11-22 DIAGNOSIS — I959 Hypotension, unspecified: Principal | ICD-10-CM

## 2020-11-22 LAB — CBC W/ AUTO DIFF
BASOPHILS ABSOLUTE COUNT: 0 10*9/L (ref 0.0–0.1)
BASOPHILS RELATIVE PERCENT: 0.3 %
EOSINOPHILS ABSOLUTE COUNT: 0.2 10*9/L (ref 0.0–0.7)
EOSINOPHILS RELATIVE PERCENT: 3.1 %
HEMATOCRIT: 34.2 % — ABNORMAL LOW (ref 38.0–50.0)
HEMOGLOBIN: 11.5 g/dL — ABNORMAL LOW (ref 13.5–17.5)
LYMPHOCYTES ABSOLUTE COUNT: 0.4 10*9/L — ABNORMAL LOW (ref 0.7–4.0)
LYMPHOCYTES RELATIVE PERCENT: 5.2 %
MEAN CORPUSCULAR HEMOGLOBIN CONC: 33.7 g/dL (ref 30.0–36.0)
MEAN CORPUSCULAR HEMOGLOBIN: 33.9 pg (ref 26.0–34.0)
MEAN CORPUSCULAR VOLUME: 100.7 fL — ABNORMAL HIGH (ref 81.0–95.0)
MEAN PLATELET VOLUME: 6.7 fL — ABNORMAL LOW (ref 7.0–10.0)
MONOCYTES ABSOLUTE COUNT: 0.6 10*9/L (ref 0.1–1.0)
MONOCYTES RELATIVE PERCENT: 8.9 %
NEUTROPHILS ABSOLUTE COUNT: 6 10*9/L (ref 1.7–7.7)
NEUTROPHILS RELATIVE PERCENT: 82.5 %
PLATELET COUNT: 192 10*9/L (ref 150–450)
RED BLOOD CELL COUNT: 3.39 10*12/L — ABNORMAL LOW (ref 4.32–5.72)
RED CELL DISTRIBUTION WIDTH: 18.3 % — ABNORMAL HIGH (ref 12.0–15.0)
WBC ADJUSTED: 7.3 10*9/L (ref 3.5–10.5)

## 2020-11-22 LAB — COMPREHENSIVE METABOLIC PANEL
ALBUMIN: 3.1 g/dL — ABNORMAL LOW (ref 3.5–5.0)
ALKALINE PHOSPHATASE: 96 U/L (ref 46–116)
ALT (SGPT): 12 U/L (ref 12–78)
ANION GAP: 5 mmol/L (ref 3–11)
AST (SGOT): 16 U/L (ref 15–40)
BILIRUBIN TOTAL: 0.4 mg/dL (ref 0.2–1.0)
BLOOD UREA NITROGEN: 16 mg/dL (ref 8–20)
BUN / CREAT RATIO: 14
CALCIUM: 8.8 mg/dL (ref 8.5–10.1)
CHLORIDE: 106 mmol/L (ref 98–107)
CO2: 29.2 mmol/L (ref 21.0–32.0)
CREATININE: 1.18 mg/dL (ref 0.80–1.30)
EGFR CKD-EPI AA MALE: 74 mL/min/{1.73_m2}
EGFR CKD-EPI NON-AA MALE: 64 mL/min/{1.73_m2}
GLUCOSE RANDOM: 98 mg/dL (ref 70–179)
POTASSIUM: 3.4 mmol/L — ABNORMAL LOW (ref 3.5–5.0)
PROTEIN TOTAL: 7 g/dL (ref 6.0–8.0)
SODIUM: 140 mmol/L (ref 135–145)

## 2020-11-22 LAB — MAGNESIUM: MAGNESIUM: 1.6 mg/dL — ABNORMAL LOW (ref 1.8–2.4)

## 2020-11-22 MED ORDER — POTASSIUM CHLORIDE ER 10 MEQ TABLET, EXTENDED RELEASE WRAPPER
ORAL_TABLET | Freq: Every day | ORAL | 0 refills | 5.00000 days | Status: CP
Start: 2020-11-22 — End: 2020-11-27

## 2020-11-22 MED ORDER — DIPHENOXYLATE-ATROPINE 2.5 MG-0.025 MG TABLET
ORAL_TABLET | Freq: Four times a day (QID) | ORAL | 1 refills | 30.00000 days | Status: CP | PRN
Start: 2020-11-22 — End: 2021-01-21

## 2020-11-22 MED ORDER — OXYCODONE 5 MG TABLET
ORAL_TABLET | ORAL | 0 refills | 5.00000 days | Status: CP | PRN
Start: 2020-11-22 — End: ?

## 2020-11-22 MED ADMIN — sodium chloride (NS) 0.9 % infusion 1,000 mL 1,000 mL: 1000 mL | INTRAVENOUS | @ 16:00:00 | Stop: 2020-11-22

## 2020-11-22 MED ADMIN — heparin preservative-free injection 10 units/mL syringe (HEPARIN LOCK FLUSH): 50 [IU] | INTRAVENOUS | @ 18:00:00 | Stop: 2020-11-22

## 2020-11-22 MED FILL — CARBAMAZEPINE ER 200 MG CAPSULE,EXTENDED RELEASE 12HR: 90 days supply | Qty: 360 | Fill #0

## 2020-11-22 MED FILL — LISINOPRIL 20 MG-HYDROCHLOROTHIAZIDE 25 MG TABLET: ORAL | 90 days supply | Qty: 90 | Fill #1

## 2020-11-22 MED FILL — ELIQUIS 5 MG TABLET: ORAL | 90 days supply | Qty: 180 | Fill #2

## 2020-11-22 NOTE — Unmapped (Signed)
rec'v call and voice mail from pt. Pt advised Rx for Silvadene had not been rec'v at [facility]. Upon review Rx sent to different pharmacy. RN faxed Rx to [facility] in [place]. Followed up with pharmacy. They confirmed rec'v Rx and would be filled within next 30 min. Called pt. S/w Mary/spouse. Advised per above. They will pick up med accordingly.

## 2020-11-22 NOTE — Unmapped (Signed)
Infusion completed, tolerated well, VSS.  Patient provided blankets, drinks, and snacks as needed throughout infusion.  Comfort continually assessed by staff.  Port de-accessed per [place] protocol. Patient discharged in stable condition, ambulatory.  Return to clinic information provided.

## 2020-11-22 NOTE — Unmapped (Signed)
Saw patient in infusion today.  Patient's abdomen is soft, not tender to touch.  Dustin Lopez states he is having diarrhea pretty much every 2 hours.  The stool is usually clear liquid and Dustin Lopez does not notice a foul smell.  He is taking imodium 2 tablets at the start of the day and then 1 tablet every two hours the remainder of the day.  He does not feel the imodium is helping with the diarrhea at all.  His bottom is very sore and painful.  RadOnc prescribed a salve that he is eager to get today.  Therefore, patient declines IV mag/potassium today.  He confirms he will stay for these supplements tomorrow.  Patient is not wiping after episodes of diarrhea, he is instead soaking in the bath.  It is too painful to wipe.  Mr. Dustin Lopez is requesting an oxycodone refill for his rectal pain.  He is no longer having foot pain and his leg pain is not persistent.      Advised Mr. Dustin Lopez that we would be sending in lomotil for him to take every 6 hours.  Advised him to schedule this every 6 hours to help decrease diarrhea.  Patient states he will.  Advised him to continue imodium 1 tablet every 2-4 hours as needed along with lomotil.  Also informed Mr. Dustin Lopez that we are sending an oral potassium supplement to the pharmacy that he should take once daily for 5 days.  Mr. Dustin Lopez appreciative and confirms his appointment for 10 am tomorrow.

## 2020-11-22 NOTE — Unmapped (Signed)
Pt seen today by Dr Ottis Stain for weekly progress check. Reviewed meds and allergies with pt. Pt with PO Xeloda with radiation. No RN in for progress check; chaperone offered. Pt seen prior to treatment. Pt report increased weakness and dehydration. RN s/w Dr Rodney Booze Onc to advise pt status and radiation held until Monday for evaluation. Dr Jomarie Longs will have clinic contact pt to schedule labs and IVF accordingly.       11/22/20 0905   Gastrointestinal   Diarrhea Grade 2  (pt notes diarrhea since last Wed night with no relief. pt notes PRN Imodium. pt reports taking x3 yesterday.)   Nausea Grade 0  (denies N/V)   Vomiting Grade 0   General Disorders and Administration Site Conditions   Fatigue Grade 2  (increased fatigue. pt states weak d/t dehydration)   Pain Grade 2  (increased pain at 9 in perineum/anal/buttock)   Renal and Urinary   Urinary Tract Pain Grade 1  (increased freq, Q2hr. denies pain or burning with urination)   Skin and Subcutaneous Tissue   Rash Maculo-Papular   (skin assessed by MD; no RN present - chaperone offered)

## 2020-11-22 NOTE — Unmapped (Signed)
DIARRHEA, GAS OR CRAMPING    While receiving chemotherapy or radiation treatments, you may have irritation to the GI tract and you may develop diarrhea, gas or cramping.  Diarrhea is an increase of three (3) or more stools per day or an increase in liquidity of bowel movements. Diarrhea may be caused by emotional stress, certain medications like antibiotics or chemotherapy or a form of malabsorption. It is essential to drink an additional quart of water during diarrhea.    Diarrhea causes food to pass very quickly through the stomach and intestines so that the body absorbs fewer essential vitamins, minerals and water.  Eating a lower fiber diet will help reduce the number of soft stools, and additional protein will heal damaged tissue. It is important to continue eating and drinking in smaller amounts more often, which helps digestion and reduces abdominal pressure.  Keep a record of the number of bowel movements and times to assess and report to physician.    Watch for signs of dehydration  Dryness of your skin and mouth  Decreased volume or dark urine  Increased tiredness  Dizziness or feeling light-headed    Suggestions to Avoid or Reduce Diarrhea    Eat 4-6 small meals daily, with healthy snacks and water between meals.   Try warm instead of hot or cold foods.  Hot foods speed up the rate food travels through the stomach and intestines.  Eat foods low in fiber to reduce stool volume.  These include chicken, eggs, white potatoes without skin, cooked cereals, white bread, white rice, yogurt, canned or well-cooked vegetables, applesauce, small bananas, rice cereal, rice cakes or crackers, saltine crackers, peanut butter and baked or grilled fish.  Avoid foods that cause gas and cramping.  These may include beer, broccoli, cabbage, cauliflower, chewing gum, corn, dried beans and peas, fried foods, fruits, onions, raw vegetables, and spicy foods that contain hot peppers, curry, garlic or Cajun spices.    Limit or avoid carbonated sodas that release gas into stomach.  Eat a low fat, low lactose diet and limit milk products to yogurt and cheese, if binding.  Try to chew with your mouth closed.  Talking while you chew may cause you to swallow too much air that can lead to gas and cramping.  Drink beverages 45 minutes to one hour before or after meals.    Minimize activity for one hour after meals.  Limit beverages that contain caffeine, like coffee, strong tea, colas or other sodas, and chocolate.      Add nutmeg to food.  This spice may help to slow movement of your bowels.  Avoid sorbitol and sugar-alcohol containing products like sugar free candies, syrup or gum.  Avoid alcohol, which is a diuretic and a stimulant.   Severe Diarrhea    Severe diarrhea is defined as eight (8) or more loose, watery stools per day or diarrhea lasting for three (3) days. If you develop severe diarrhea, call your doctor.  S/he may prescribe an anti-diarrheal medication or make other suggestions to treat your diarrhea.  Also call your doctor if you           Feel weak or have signs of dehydration  Cannot eat or drink       See pus or blood in your bowel movements Have severe cramping or pain       Have an increase in frequency of diarrhea  Develop a fever    Recovery Suggestions  Eat and drink only clear liquids  for one day.  Clear liquids include broth, weak tea,  decaffeinated coffee, gelatin, water, popsicles, pulp-free fruit punches, fruit drinks, and sugar free beverages like Crystal Light, and juice based supplements.  Drink at least three quarts or 96 oz. of fluids each day.  Drinking more liquid does not cause more diarrhea. You must replace the extra fluid you are losing.  Limit carbonation and let ginger ale go flat to limit the gas released.  Once slowed or stopped, gradually add food low in fiber over the next several days. By adding one new food at the time, you can determine if a certain food causes diarrhea.  Try eating grated raw peeled apple or applesauce every 2-4 hours as tolerated. The pectin in apples helps to control diarrhea.  Foods that contain large amounts of potassium will help replace that lost in stools:  apricots, apricot or peach nectar, bananas, orange, grapefruit or tomato juice, potatoes, yogurt. Add fruits in gradually.        Avoid         Fried foods   Hot peppers   Whole grain breads        Rich desserts   Raw vegetables  Alcohol         Milk products              Raw fruits (except bananas & peeled apple)       Fatty foods like bacon, cold cuts, corned beef, frankfurters, pastrami, and sausage       High caffeine beverages: coffee, Mountain Dew, Mello Yellow, Dr. Reino Kent, colas     Comfort Measures  Clean rectal area with warm water gently and thoroughly after each bowel movement; pat dry.    Apply Desitin or A&D Ointment around the rectal area.   Take a warm bath or a sitz bath.   Use a heating pad or moist heat on your abdomen if experiencing abdominal cramping, but do NOT use a heating pad on an area to which you are receiving radiation.  As your nurse or physician if you have questions or are uncertain.                DIARRHEA       1.  Replace lost fluids with clear liquids served cold or at room temperature.    2.  Avoid milk products and caffeine.    3.  Eat foods containing pectin ( bananas, avocados, beets, unspiced applesauce, and peeled apples)    4.  Eat foods low in fiber such as bananas, rice, applesauce, toast, mashed potatoes, eggs, fish, cottage cheese, and yogurt.    5.  Avoid foods which cause gas and are acidic, fatty or fried ( whole grain products, nuts. Seeds, popcorn, pickles, relishes, rich pastries, raw vegetables).    6.  Increase foods or liquids containing potassium such as apricots, peach nectar, bananas, mashed or baked potatoes, GatrorAde and PowerAde.    Imodium Instructions:    Day 1:  If you have three (loose or watery) diarrhea bowel movements in one day take two Imodium after each loose stool and one after the other loose stools but do NOT take more than eight Imodium tablets in 24 hours.    Day 2:  If your diarrhea has stopped, then STOP the Imodium.  If the diarrhea has not stopped continue with day 1 instructions.    Day 3:  If your diarrhea is not resolving, please call the clinic.  continue with day 1 instructions.    Day 3:  If your diarrhea is not resolving, please call the clinic.

## 2020-11-23 ENCOUNTER — Institutional Professional Consult (permissible substitution): Admit: 2020-11-23 | Discharge: 2020-11-24 | Payer: MEDICARE

## 2020-11-23 ENCOUNTER — Ambulatory Visit: Admit: 2020-11-23 | Discharge: 2020-11-24 | Payer: MEDICARE

## 2020-11-23 DIAGNOSIS — C210 Malignant neoplasm of anus, unspecified: Principal | ICD-10-CM

## 2020-11-23 DIAGNOSIS — I959 Hypotension, unspecified: Principal | ICD-10-CM

## 2020-11-23 MED ADMIN — potassium chloride 20 mEq in 100 mL IVPB Premix: 20 meq | INTRAVENOUS | @ 16:00:00 | Stop: 2020-11-23

## 2020-11-23 MED ADMIN — magnesium sulfate in water 2 gram/50 mL (4 %) IVPB 2 g: 2 g | INTRAVENOUS | @ 16:00:00 | Stop: 2020-11-23

## 2020-11-23 MED ADMIN — heparin preservative-free injection 10 units/mL syringe (HEPARIN LOCK FLUSH): 50 [IU] | INTRAVENOUS | @ 18:00:00 | Stop: 2020-11-23

## 2020-11-23 MED ADMIN — sodium chloride (NS) 0.9 % infusion 1,000 mL 1,000 mL: 1000 mL | INTRAVENOUS | @ 16:00:00 | Stop: 2020-11-23

## 2020-11-23 NOTE — Unmapped (Signed)
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  He was prescribed lomotil and oxy yesterday but has not picked them up yet.  He will get them today after treatment and start.  Reviewed how to take lomotil alternating with imodium, encouraged hydration  Will touch base with him tomorrow to see if he has had some slowing of the diarrhea.

## 2020-11-23 NOTE — Unmapped (Signed)
Infusion completed, tolerated well, VSS.  Patient provided blankets, drinks, and snacks as needed throughout infusion.  Comfort continually assessed by staff.  Port needle de-accessed per [facility] protocol. Patient discharged in stable condition, ambulatory.  Return to clinic information provided.

## 2020-11-24 NOTE — Unmapped (Signed)
Call to pt to check on him.  He has picked up his lomotil and started it.  He states he feels well today, his diarrhea has slowed down.  He knows to call for further issues and in a timely manner so he doesn't get dehydrated again

## 2020-11-27 DIAGNOSIS — C210 Malignant neoplasm of anus, unspecified: Principal | ICD-10-CM

## 2020-11-27 NOTE — Unmapped (Addendum)
Pt seen today by Dr Ottis Stain for weekly progress check. Reviewed meds and allergies with pt. Pt with PO Xeloda with radiation. No RN in for progress check; chaperone offered. Pt seen prior to treatment. Dr Ottis Stain advised to continue break until Wed 3/2. Will have Dr Carney Bern complete skin check prior to treatment. 21iX advised of pt status.        11/27/20 0925   Gastrointestinal   Diarrhea Grade 1  (diarrhea improved on break. last episode this am. pt with no epsiodes for 2 days and no PRN Imodium for 1 day. pt had been using PRN Imoduim Q 6hrs)   General Disorders and Administration Site Conditions   Fatigue Grade 2  (still with fatigue)   Pain Grade 1  (pain improved in rectum/buttock at 4 today)   Renal and Urinary   Urinary Tract Pain Grade 0  (pt denies any issues with urination)   Skin and Subcutaneous Tissue   Rash Maculo-Papular   (no RN in for exam; chaperone offered. pt notes some improvement. cont with soaks and Silvadene)

## 2020-11-27 NOTE — Unmapped (Signed)
RADIATION TREATMENT MANAGEMENT NOTE     Encounter Date: 11/27/2020  Patient Name: Dustin Lopez  Medical Record Number: [id number]    DIAGNOSIS:    1. Anal adenocarcinoma (CMS-HCC)        STAGE:  cT4N1    TREATMENT PLAN:  Neoadjuvant chemoradiation.    SUBJECTIVE:  Feeling much better overall.  Diarrhea had improved significantly with regular imodium use.  Stopped using it regularly yesterday then had some diarrhea this morning.  Plans to resume regular imodium.  Skin feels better, pain much better this week, 4/10.    PHYSICAL EXAM:  Vital Signs for this encounter:  BP 121/77  - Pulse 74  - Resp 18  - Wt 69.6 kg (153 lb 8 oz)  - SpO2 100%  - BMI 22.02 kg/m??   General:  Alert and Oriented X 3.  No acute distress.    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  Improved but persistent desquamation gluteal crease, perifistular skin.      ASSESSMENT: 3780cGy of planned 5040cGy  Tolerating RT.     RECOMMENDATIONS:  1. Plan for Therapy:  Continue treatment break for skin reaction.  Re-evaluate on Wednesday 3/2.  2. Skin/Dermatitis:  controlled.  Continue salt/soda bath soaks, aquaphor.  Continue silvadene.  3. Pain: controlled.  Cont prn oxycodone.  4. Nausea:  Cont prn compazine.  5. Diarrhea:  Continue prn imodium.        As part of weekly management, chart and films reviewed.     Benedetto Goad, MD  [facility] Radiation Oncology at [facility]

## 2020-11-27 NOTE — Unmapped (Signed)
DIARRHEA, GAS OR CRAMPING    While receiving chemotherapy or radiation treatments, you may have irritation to the GI tract and you may develop diarrhea, gas or cramping.  Diarrhea is an increase of three (3) or more stools per day or an increase in liquidity of bowel movements. Diarrhea may be caused by emotional stress, certain medications like antibiotics or chemotherapy or a form of malabsorption. It is essential to drink an additional quart of water during diarrhea.    Diarrhea causes food to pass very quickly through the stomach and intestines so that the body absorbs fewer essential vitamins, minerals and water.  Eating a lower fiber diet will help reduce the number of soft stools, and additional protein will heal damaged tissue. It is important to continue eating and drinking in smaller amounts more often, which helps digestion and reduces abdominal pressure.  Keep a record of the number of bowel movements and times to assess and report to physician.    Watch for signs of dehydration  Dryness of your skin and mouth  Decreased volume or dark urine  Increased tiredness  Dizziness or feeling light-headed    Suggestions to Avoid or Reduce Diarrhea    Eat 4-6 small meals daily, with healthy snacks and water between meals.   Try warm instead of hot or cold foods.  Hot foods speed up the rate food travels through the stomach and intestines.  Eat foods low in fiber to reduce stool volume.  These include chicken, eggs, white potatoes without skin, cooked cereals, white bread, white rice, yogurt, canned or well-cooked vegetables, applesauce, small bananas, rice cereal, rice cakes or crackers, saltine crackers, peanut butter and baked or grilled fish.  Avoid foods that cause gas and cramping.  These may include beer, broccoli, cabbage, cauliflower, chewing gum, corn, dried beans and peas, fried foods, fruits, onions, raw vegetables, and spicy foods that contain hot peppers, curry, garlic or Cajun spices.    Limit or avoid carbonated sodas that release gas into stomach.  Eat a low fat, low lactose diet and limit milk products to yogurt and cheese, if binding.  Try to chew with your mouth closed.  Talking while you chew may cause you to swallow too much air that can lead to gas and cramping.  Drink beverages 45 minutes to one hour before or after meals.    Minimize activity for one hour after meals.  Limit beverages that contain caffeine, like coffee, strong tea, colas or other sodas, and chocolate.      Add nutmeg to food.  This spice may help to slow movement of your bowels.  Avoid sorbitol and sugar-alcohol containing products like sugar free candies, syrup or gum.  Avoid alcohol, which is a diuretic and a stimulant.   Severe Diarrhea    Severe diarrhea is defined as eight (8) or more loose, watery stools per day or diarrhea lasting for three (3) days. If you develop severe diarrhea, call your doctor.  S/he may prescribe an anti-diarrheal medication or make other suggestions to treat your diarrhea.  Also call your doctor if you           Feel weak or have signs of dehydration  Cannot eat or drink       See pus or blood in your bowel movements Have severe cramping or pain       Have an increase in frequency of diarrhea  Develop a fever    Recovery Suggestions  Eat and drink only clear liquids   for one day.  Clear liquids include broth, weak tea,  decaffeinated coffee, gelatin, water, popsicles, pulp-free fruit punches, fruit drinks, and sugar free beverages like Crystal Light, and juice based supplements.  Drink at least three quarts or 96 oz. of fluids each day.  Drinking more liquid does not cause more diarrhea. You must replace the extra fluid you are losing.  Limit carbonation and let ginger ale go flat to limit the gas released.  Once slowed or stopped, gradually add food low in fiber over the next several days. By adding one new food at the time, you can determine if a certain food causes diarrhea.  Try eating grated raw peeled apple or applesauce every 2-4 hours as tolerated. The pectin in apples helps to control diarrhea.  Foods that contain large amounts of potassium will help replace that lost in stools:  apricots, apricot or peach nectar, bananas, orange, grapefruit or tomato juice, potatoes, yogurt. Add fruits in gradually.        Avoid         Fried foods   Hot peppers   Whole grain breads        Rich desserts   Raw vegetables  Alcohol         Milk products              Raw fruits (except bananas & peeled apple)       Fatty foods like bacon, cold cuts, corned beef, frankfurters, pastrami, and sausage       High caffeine beverages: coffee, Mountain Dew, Mello Yellow, Dr. Pepper, colas     Comfort Measures  Clean rectal area with warm water gently and thoroughly after each bowel movement; pat dry.    Apply Desitin or A&D Ointment around the rectal area.   Take a warm bath or a sitz bath.   Use a heating pad or moist heat on your abdomen if experiencing abdominal cramping, but do NOT use a heating pad on an area to which you are receiving radiation.  As your nurse or physician if you have questions or are uncertain.

## 2020-11-27 NOTE — Unmapped (Signed)
Attempted oncology nutrition assessment this day via phone.  No answer, left voice message along with RD name and contact information, encouragement to return call.

## 2020-11-27 NOTE — Unmapped (Signed)
RADIATION TREATMENT MANAGEMENT NOTE     Encounter Date: 11/22/2020  Patient Name: Dustin Lopez  Medical Record Number: [id number]    DIAGNOSIS:    1. Anal adenocarcinoma (CMS-HCC)        STAGE:  cT4N1    TREATMENT PLAN:  Neoadjuvant chemoradiation.    SUBJECTIVE:  On break since Monday due to perianal skin breakdown.  Dr. Carney Bern prescribed silvadene, but he hasn't picked it up yet.  Diarrhea has increased in frequency over the last week, has used imodium but sparingly.  Feels dehydrated.    PHYSICAL EXAM:  Vital Signs for this encounter:  BP 117/80  - Pulse 110  - Temp 37.2 ??C (98.9 ??F) (Skin)  - Resp 18  - Wt 68.3 kg (150 lb 8 oz)  - SpO2 100%  - BMI 21.59 kg/m??   General:  Alert and Oriented X 3.  No acute distress.    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  New moist desquamation in gluteal crease, skin near fistula.    ASSESSMENT: 3780cGy of planned 5040cGy  Tolerating RT.     RECOMMENDATIONS:  1. Plan for Therapy:  Continue treatment break for skin reaction.  Re-evaluate on Monday 2/28.  2. Skin/Dermatitis:  controlled.  Continue salt/soda bath soaks, aquaphor.  Advised patient to pick up and use silvadene.  3. Pain: controlled.  Cont prn oxycodone.  4. Nausea:  Cont prn compazine.  5. Diarrhea:  Discussed more frequent use of prn imodium.  6. Dehydration:  Will attempt to arrange for IVF today with Dr. Jomarie Longs.      As part of weekly management, chart and films reviewed.     Benedetto Goad, MD  [facility] Radiation Oncology at [facility]

## 2020-11-27 NOTE — Unmapped (Signed)
Outpatient Nutrition Note  Visit type: phone    RT nutrition referral.     Assessment:      Patient returned call and states he was having a hard time eating anything and most foods would cause diarrhea.  Patient states he had a break from RT therefore he has had a little more the last couple of days (banana, lettuce/ cucumbers, Boost and wife currently making eggs and bacon).  Patient states weight loss of 10lb x week.  Noted weight variable likely due to fluid status.  Verbal education re: tips for increasing calories and protein and ways to as well as reasons to maximize nutrition.  Encouragement to reach out to RD as nutritional concerns/ needs arise.     Clinical  Dx: anal cancer  Treatment:  Folfox/ RT  Past Medical History:  Past Medical History:   Diagnosis Date   ??? Abnormal ECG    ??? ARF (acute renal failure) (CMS-HCC) 03/17/2014   ??? Atrial fibrillation (CMS-HCC)    ??? Colon cancer (CMS-HCC)    ??? COPD (chronic obstructive pulmonary disease) (CMS-HCC)    ??? Crohn's disease (CMS-HCC)    ??? Hyperlipidemia    ??? Hypertension    ??? Rectal bleeding    ??? Seizures (CMS-HCC)    ??? Sepsis (CMS-HCC) 03/17/2014   ??? Stroke (CMS-HCC)     difficulty with numbers and time; no other deficits         Anthropometrics   Height: 70  Current Weight: 153 lb  BMI: 22.02  Ideal Body Weight/ %IBW: 166lb/ 92%  UBW: 170 lb     Weight history:  Wt Readings from Last 12 Encounters:   11/27/20 69.6 kg (153 lb 8 oz)   11/23/20 68.8 kg (151 lb 10.8 oz)   11/22/20 68.5 kg (151 lb 0.2 oz)   11/22/20 68.3 kg (150 lb 8 oz)   11/15/20 72.8 kg (160 lb 8 oz)   11/09/20 72.6 kg (160 lb 1.6 oz)   11/06/20 69.7 kg (153 lb 12 oz)   11/02/20 72.1 kg (159 lb)   10/30/20 71.1 kg (156 lb 12 oz)   10/23/20 74.4 kg (164 lb)   10/02/20 73.8 kg (162 lb 12 oz)   09/12/20 74.8 kg (164 lb 14.5 oz)         Recent weight changes: variable weight trends, noted dehydration/ fluids 6.7%/ 11lb loss in 2.5-3 months     Nutrition History:   Allergies: NKFA  Home Diet and Appetite: decreased  Dietary Intolerances: na    24hr recall/usual intakes  Minimal intake noted recently.       Nutrition-Related Labs and Meds  Labs:.  Recent Labs   Lab Units 11/22/20  1133   SODIUM mmol/L 140   POTASSIUM mmol/L 3.4*   CHLORIDE mmol/L 106   CO2 mmol/L 29.2   BUN mg/dL 16   CREATININE mg/dL 1.61   EGFR CKD-EPI AA MALE mL/min/1.32m2 74   EGFR CKD-EPI NON-AA MALE mL/min/1.66m2 64   GLUCOSE mg/dL 98   CALCIUM mg/dL 8.8   ALBUMIN g/dL 3.1*   PROTEIN TOTAL g/dL 7.0   BILIRUBIN TOTAL mg/dL 0.4   ALK PHOS U/L 96   ALT U/L 12   AST U/L 16     .  Recent Labs   Lab Units 11/22/20  1133   WBC 10*9/L 7.3   RBC 10*12/L 3.39*   HEMOGLOBIN g/dL 09.6*   HEMATOCRIT % 04.5*   MCV fL 100.7*   MCH pg 33.9  MCHC g/dL 16.1   RDW % 09.6*   PLATELET COUNT (1) 10*9/L 192   MPV fL 6.7*       Meds:   Current Outpatient Medications on File Prior to Visit   Medication Sig Dispense Refill   ??? acetaminophen (TYLENOL) 325 MG tablet Take 2 tablets (650 mg total) by mouth Every six (6) hours. 24 tablet 0   ??? albuterol HFA 90 mcg/actuation inhaler Inhale 2 puffs every six (6) hours as needed for wheezing. 18 g 5   ??? apixaban (ELIQUIS) 5 mg Tab Take 1 tablet (5 mg total) by mouth two (2) times a day. 180 tablet 3   ??? aspirin 81 MG chewable tablet Chew 1 tablet (81 mg total) daily. (Patient taking differently: Chew 81 mg nightly. ) 90 tablet 3   ??? atorvastatin (LIPITOR) 40 MG tablet Take 1 tablet (40 mg total) by mouth nightly at bedtime. 90 tablet 3   ??? capecitabine (XELODA) 150 MG tablet Take 4 tablets (600 mg total) by mouth Two (2) times a day  with 1 other capecitabine prescription for 1,600 mg total. Take Monday through Friday during Radiation. 240 tablet 0   ??? capecitabine (XELODA) 500 MG tablet Take 2 tablets (1,000 mg total) by mouth Two (2) times a day  with 1 other capecitabine prescription for 1,600 mg total. Take Monday through Friday during Radiation. 120 tablet 0   ??? carBAMazepine (CARBATROL) 200 MG 12 hr capsule Take 2 capsule by mouth twice daily. 360 capsule 1   ??? diphenoxylate-atropine (LOMOTIL) 2.5-0.025 mg per tablet Take 1 tablet by mouth four (4) times a day as needed for diarrhea. Take 1 tablet every 6 hours for persistent diarrhea. 120 tablet 1   ??? fluticasone propion-salmeteroL (ADVAIR) 250-50 mcg/dose diskus Inhale 1 puff 2 (two) times a day. 180 each 3   ??? furosemide (LASIX) 20 MG tablet Take 1 tablet (20 mg total) by mouth daily as needed for swelling or other (for worsened sob). 30 tablet 5   ??? heparin, porcine, PF, 100 unit/mL Syrg Infuse 5 mL into a venous catheter every fourteen (14) days. For use while patient is on fluorouracil (5-FU) home infusion.   Flush IV catheter with heparin 5 mL as directed after final saline flush per SASH method and as needed for line maintenance. 4 each PRN   ??? ibuprofen (MOTRIN) 800 MG tablet Take 800 mg by mouth every six (6) hours as needed.     ??? lidocaine-prilocaine (EMLA) 2.5-2.5 % cream Apply to port 60 min prior to chemo infusion 5 g 3   ??? lisinopriL-hydrochlorothiazide (PRINZIDE,ZESTORETIC) 20-25 mg per tablet Take 1 tablet by mouth daily. 90 tablet 3   ??? loperamide (IMODIUM) 2 mg capsule Take 2 capsules to start then 1 capsule every 2 hours until diarrhea free for 12 hours. 60 capsule 2   ??? LORazepam (ATIVAN) 1 MG tablet TAKE 1 TABLET (1 MG TOTAL) BY MOUTH NIGHTLY AS NEEDED (INSOMNIA). 30 tablet 0   ??? metoprolol tartrate (LOPRESSOR) 100 MG tablet Take 1 tablet (100 mg total) by mouth two (2) times a day. NEEDS APPT FOR FURTHER REFILLS! 180 tablet 0   ??? oxyCODONE (ROXICODONE) 5 MG immediate release tablet Take 1 tablet (5 mg total) by mouth every four (4) hours as needed for pain. 30 tablet 0   ??? potassium chloride (KLOR-CON) 10 MEQ CR tablet Take 1 tablet (10 mEq total) by mouth daily for 5 days. 5 tablet 0   ??? pregabalin (LYRICA)  50 MG capsule Take 1 capsule (50 mg total) by mouth Three (3) times a day. 90 capsule 2   ??? prochlorperazine (COMPAZINE) 10 MG tablet Take 1 tablet (10 mg total) by mouth every six (6) hours as needed for nausea or vomiting. 30 tablet 2   ??? silver sulfaDIAZINE (SILVADENE, SSD) 1 % cream Apply to affected area twice a day. 50 g 1   ??? sodium chloride (NS) 0.9 % injection Infuse 10 mL into a venous catheter every fourteen (14) days. For use while patient is on fluorouracil (5-FU) home infusion.   Flush IV catheter with normal saline 10 mL prior to and after infusion followed by heparin flush per SASH method and as needed for line maintenance. 6 each PRN   ??? traZODone (DESYREL) 100 MG tablet Take 1 tablet (100 mg total) by mouth daily. 90 tablet 3     Current Facility-Administered Medications on File Prior to Visit   Medication Dose Route Frequency Provider Last Rate Last Admin   ??? albumin human 5 % bottle 25 g  25 g Intravenous Once Leia Alf, MD       ??? albumin human 5 % bottle 25 g  25 g Intravenous Once Leia Alf, MD              Nutrition-Focused Physical Findings   Physical Activity: na  NFPA: deferred     Current Nutrition Impact Symptoms:  GI: diarrhea  Oral: lack of appetite/ taste/ etc  Other: na     Daily Estimated Nutritent Needs    Calories: 2300 kcal [ Per Huey Romans per Last Recorded Body Weight ]  (Stress Factor 1.2 , Activity Factor 1.3)  Protein: 69-82 g [1.0- 1.2 g per Last Recorded Body Weight]  Carbohydrates:  [ No Restriction ]  Fluid: 2300 mL [ 1 mL/kcal (maintenance) per Last Recorded Body Weight]       Nutrition Diagnosis      Inadequate oral intake related to side effects of cancer treatments and poor po intake as evidenced by patient reports and 6.7% weight loss 2-3 months time.      Nutrition Intervention      Nutrition Goals  1. Maintain WT within 1% each week  2. Meet >75% energy and protein needs daily  3. Eat smaller more frequent eatings every 2-3 hours    Nutrition Prescription  1. Verbal education re: tips for increasing calories and protein and ways to as well as reasons to maximize nutrition.  Encouragement to reach out to RD as nutritional concerns/ needs arise.          Monitoring/Evaluation   Weight, intakes, tolerance, labs, POC and education needs as they arise        Jimmy Footman, RD

## 2020-11-28 ENCOUNTER — Ambulatory Visit: Admit: 2020-11-28 | Discharge: 2020-12-28 | Payer: MEDICARE

## 2020-11-28 NOTE — Unmapped (Signed)
Per Dr. Jomarie Longs, patient should come into the clinic today and another day this week for IVF and possible electrolyte replacement.  Call placed to patient but no answer so left information on VM with request for return call.

## 2020-11-29 NOTE — Unmapped (Signed)
RADIATION TREATMENT MANAGEMENT NOTE     Encounter Date: 11/29/2020  Patient Name: Dustin Lopez  Medical Record Number: [id number]    DIAGNOSIS:    No diagnosis found.    STAGE:  cT4N1    TREATMENT PLAN:  Neoadjuvant chemoradiation.    SUBJECTIVE:  Feeling much better overall.  Diarrhea has resolved. Skin feels much better.     PHYSICAL EXAM:  Vital Signs for this encounter:  Wt 70.1 kg (154 lb 8 oz)  - BMI 22.17 kg/m??   General:  Alert and Oriented X 3.  No acute distress.    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  Moist desquamation resolved.     ASSESSMENT: 3780cGy of planned 5040cGy  Tolerating RT.     RECOMMENDATIONS:  1. Plan for Therapy:  Continue treatment as planned  2. Skin/Dermatitis:  controlled.  Continue salt/soda bath soaks, aquaphor.  Continue silvadene.  3. Pain: controlled.  Cont prn oxycodone.  4. Nausea:  Cont prn compazine.  5. Diarrhea:  Continue prn imodium.        As part of weekly management, chart and films reviewed.     Griselda Miner, MD  [facility] Radiation Oncology at [facility]

## 2020-11-29 NOTE — Unmapped (Signed)
Pt seen today by Dr Carney Bern to check skin prior to resume of treatment. Pt states much better. Pain improved. Appetite good. Chaperone offered. No RN in for exam. Dr Carney Bern will exam on treatment area.

## 2020-12-01 MED ORDER — SILVER SULFADIAZINE 1 % TOPICAL CREAM
1 refills | 0.00000 days | Status: CP
Start: 2020-12-01 — End: 2021-12-01

## 2020-12-05 NOTE — Unmapped (Addendum)
Received call-back from patient.  He states that his last treatment for radiation is this Thursday.  reviewed that he would not take any more Xeloda after this Thursday.  He states that the radiation staff did tell him not to take the Xeloda while his RT was held last week.    No issues with HFS or mucositis .  He does state that he continues to have decreased appetite, nausea/vomiting, and diarrhea.  He states that he has taken Imodium which has helped.  He then went and ate [place] food and started having diarrhea again.  Reviewed with him things he shouldn't eat as they could increase his stomach sensitivity and diarrhea.      Patient will come to clinic this Friday for labs/IVF at 2pm.  He cannot come any earlier than that.  Also verified with patient that we can share any information about his care with his wife.  Informed him that there is a consent to shore limited information and it does not list his wife, however there were prior consents on file that did limit info that could be shared with her.  Patient states that any of his information related to his care CAN be shared with his wife.  No issues or questions at this time. He will call if any arise.

## 2020-12-05 NOTE — Unmapped (Signed)
Patient's daughter called to inform me that Dustin Lopez completes his radiation this Thursday 3/10.  She would like to know when Dustin Lopez should follow up with Dr. Jomarie Longs.    Discussed with Dr. Jomarie Longs who would like to defer to Helmut Muster to ensure patient is taking xeloda appropriately and also to get him scheduled for labs/IVF this Friday 3/11.    Dr. Jomarie Longs wants more time to consider when to get [facility] in for scans/follow up with him.  It will likely be early April and I will be in touch with Kathy/Thurmon regarding follow-up and surgical planning.    Chart to [place].

## 2020-12-05 NOTE — Unmapped (Addendum)
Discharge Instructions ??? Pelvis Radiation Treatments (Male)  Congratulations! You have finished your Radiation Treatments to your Pelvis (Male)             Your radiation doctor was Dr Ottis Stain and your radiation nurse was Ines Bloomer.  Please call us if you have any problems, questions or concerns regarding the effects of your treatment.       [facility] Radiation Oncology in [place]    [phone]    General Information: Some patients question if radiation remains in the body after treatment.  ??? During treatment the radiation passes through your body but does not remain inside the body.   ??? You are not ???radioactive???. However, radiation works to destroy cancer cells for weeks after treatments.    ??? This is why cancer cells continue to be killed and symptoms from injuries to normal tissue may persist for a few days up to a few weeks.     Discharge Instructions:  ??? It may take several weeks for your normal urinary and bowel habits to return.  ??? If you have problems with diarrhea, continue eating a low fiber diet to help reduce the number of soft or loose stools. This will also assist in the healing of damaged tissues.  ??? Avoid these foods if you have excess gas and cramping: beer, broccoli, cabbages, cauliflower, chewing gum, colas, corn, fried foods, raw fruits and vegetables.   ??? If your rectal area is sore, gently clean with warm water after each bowel movement, and then pat dry. You can apply Desitin, Aquaphor, or A&D ointment around the rectal area to help soothe.  ??? Other instructions: follow-up with Dr Ottis Stain on Mon 3/28 at 10 am.    Cancer Rehabilitation:  ??? Fatigue is a very common experience following any form of cancer treatment.  Regular exercise is a good way to help with your feelings of tiredness. Check with your doctor before starting a new exercise regimen. Gradually increase your exercise program.   ??? Cancer Rehabilitation services are available to all patients undergoing cancer treatments. These services provide cancer patient assessments, education, and rehabilitation designed to combat fatigue and other side effects brought on by cancer treatments. If you have any questions about this program contact [facility] at [phone].

## 2020-12-05 NOTE — Unmapped (Signed)
Call placed to patient to discuss his Xeloda and side effects as well as get him scheduled for labs/IVF this Friday, 3/11.  No answer on cell so left voicemail.  Called home phone and his wife answered and stated that he was out.  Explained the nature of the call and she stated that she would get in touch with him and get him to call back.

## 2020-12-05 NOTE — Unmapped (Addendum)
Pt seen today by Dr Carney Bern for weekly progress check. Reviewed meds and allergies with pt. Pt with PO Xeloda with radiation. No RN in for progress check; no sensitive exam. Skin checked in treatment area with RT present. Discharge instructions given. Pt will contact Dr Rodney Booze Onc to schedule follow up/advise complete radiation on Thu 3/10. Pt will follow up with Dr Ottis Stain on Mon 3/28 at 10 am.        12/05/20 0953   Gastrointestinal   Diarrhea Grade 1  (diarrhea improved; no episodes in week. new PRN meds)   Nausea Grade 1  (cont with some nausea; no emesis)   Vomiting Grade 0   General Disorders and Administration Site Conditions   Fatigue Grade 2  (continues with fatigue)   Pain Grade 1  (pain improved in rectum)   Investigations   Weight Loss Grade 1  (stable weight this week; fair appetite)   Renal and Urinary   Urinary Tract Pain Grade 0  (denies any issues with urination)   Skin and Subcutaneous Tissue   Rash Maculo-Papular   (Dr Carney Bern checked skin on treatment table and new improvement)

## 2020-12-05 NOTE — Unmapped (Signed)
RADIATION TREATMENT MANAGEMENT NOTE     Encounter Date: 12/05/2020  Patient Name: Dustin Lopez  Medical Record Number: [id number]    DIAGNOSIS:    No diagnosis found.    STAGE:  cT4N1    TREATMENT PLAN:  Neoadjuvant chemoradiation.    SUBJECTIVE:  Feeling much better overall.  Diarrhea has resolved. Skin feels much better. One small area of soreness in gluteal cleft.    PHYSICAL EXAM:  Vital Signs for this encounter:  There were no vitals taken for this visit.  General:  Alert and Oriented X 3.  No acute distress.    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  Moist desquamation resolved except 1 small area in gluteal cleft.     ASSESSMENT: 4680 cGy of planned 5040 cGy  Tolerating RT much better    RECOMMENDATIONS:  1. Plan for Therapy:  Continue treatment as planned  2. Skin/Dermatitis:  controlled.  Continue salt/soda bath soaks, aquaphor.  Continue silvadene.  3. Pain: controlled.  Cont prn oxycodone.  4. Nausea:  Cont prn compazine.  5. Diarrhea:  Continue prn imodium.        As part of weekly management, chart and films reviewed.     Griselda Miner, MD  [facility] Radiation Oncology at [facility]

## 2020-12-06 DIAGNOSIS — C210 Malignant neoplasm of anus, unspecified: Principal | ICD-10-CM

## 2020-12-08 ENCOUNTER — Ambulatory Visit: Admit: 2020-12-08 | Discharge: 2020-12-08 | Payer: MEDICARE

## 2020-12-08 DIAGNOSIS — C210 Malignant neoplasm of anus, unspecified: Principal | ICD-10-CM

## 2020-12-08 DIAGNOSIS — I959 Hypotension, unspecified: Principal | ICD-10-CM

## 2020-12-08 LAB — CBC W/ AUTO DIFF
BASOPHILS ABSOLUTE COUNT: 0 10*9/L (ref 0.0–0.1)
BASOPHILS RELATIVE PERCENT: 0.2 %
EOSINOPHILS ABSOLUTE COUNT: 0.2 10*9/L (ref 0.0–0.7)
EOSINOPHILS RELATIVE PERCENT: 2.2 %
HEMATOCRIT: 36.1 % — ABNORMAL LOW (ref 38.0–50.0)
HEMOGLOBIN: 12.2 g/dL — ABNORMAL LOW (ref 13.5–17.5)
LYMPHOCYTES ABSOLUTE COUNT: 0.4 10*9/L — ABNORMAL LOW (ref 0.7–4.0)
LYMPHOCYTES RELATIVE PERCENT: 6 %
MEAN CORPUSCULAR HEMOGLOBIN CONC: 33.7 g/dL (ref 30.0–36.0)
MEAN CORPUSCULAR HEMOGLOBIN: 33.9 pg (ref 26.0–34.0)
MEAN CORPUSCULAR VOLUME: 100.7 fL — ABNORMAL HIGH (ref 81.0–95.0)
MEAN PLATELET VOLUME: 7.2 fL (ref 7.0–10.0)
MONOCYTES ABSOLUTE COUNT: 0.6 10*9/L (ref 0.1–1.0)
MONOCYTES RELATIVE PERCENT: 8.2 %
NEUTROPHILS ABSOLUTE COUNT: 6 10*9/L (ref 1.7–7.7)
NEUTROPHILS RELATIVE PERCENT: 83.4 %
PLATELET COUNT: 203 10*9/L (ref 150–450)
RED BLOOD CELL COUNT: 3.58 10*12/L — ABNORMAL LOW (ref 4.32–5.72)
RED CELL DISTRIBUTION WIDTH: 20.2 % — ABNORMAL HIGH (ref 12.0–15.0)
WBC ADJUSTED: 7.2 10*9/L (ref 3.5–10.5)

## 2020-12-08 LAB — COMPREHENSIVE METABOLIC PANEL
ALBUMIN: 3.2 g/dL — ABNORMAL LOW (ref 3.5–5.0)
ALKALINE PHOSPHATASE: 134 U/L — ABNORMAL HIGH (ref 46–116)
ALT (SGPT): 17 U/L (ref 12–78)
ANION GAP: 8 mmol/L (ref 3–11)
AST (SGOT): 17 U/L (ref 15–40)
BILIRUBIN TOTAL: 0.5 mg/dL (ref 0.2–1.0)
BLOOD UREA NITROGEN: 13 mg/dL (ref 8–20)
BUN / CREAT RATIO: 11
CALCIUM: 8.9 mg/dL (ref 8.5–10.1)
CHLORIDE: 105 mmol/L (ref 98–107)
CO2: 26.5 mmol/L (ref 21.0–32.0)
CREATININE: 1.14 mg/dL (ref 0.80–1.30)
EGFR CKD-EPI AA MALE: 77 mL/min/{1.73_m2}
EGFR CKD-EPI NON-AA MALE: 67 mL/min/{1.73_m2}
GLUCOSE RANDOM: 112 mg/dL (ref 70–179)
POTASSIUM: 3.2 mmol/L — ABNORMAL LOW (ref 3.5–5.0)
PROTEIN TOTAL: 7.5 g/dL (ref 6.0–8.0)
SODIUM: 139 mmol/L (ref 135–145)

## 2020-12-08 LAB — MAGNESIUM: MAGNESIUM: 1.7 mg/dL — ABNORMAL LOW (ref 1.8–2.4)

## 2020-12-08 LAB — CEA: CARCINOEMBRYONIC ANTIGEN: 3.4 ng/mL (ref 0.0–5.0)

## 2020-12-08 MED ORDER — NYSTATIN 100,000 UNIT/ML ORAL SUSPENSION
Freq: Four times a day (QID) | ORAL | 0 refills | 3 days | Status: CP
Start: 2020-12-08 — End: ?

## 2020-12-08 MED ADMIN — sodium chloride (NS) 0.9 % infusion 1,000 mL 1,000 mL: 1000 mL | INTRAVENOUS | @ 19:00:00 | Stop: 2020-12-08

## 2020-12-08 MED ADMIN — potassium chloride 20 mEq in 100 mL IVPB Premix: 20 meq | INTRAVENOUS | @ 20:00:00 | Stop: 2020-12-08

## 2020-12-08 MED ADMIN — heparin preservative-free injection 10 units/mL syringe (HEPARIN LOCK FLUSH): 50 [IU] | INTRAVENOUS | @ 21:00:00 | Stop: 2020-12-08

## 2020-12-08 NOTE — Unmapped (Signed)
Patient Education        Learning About Magnesium  What is magnesium?     Magnesium is a mineral that plays many important roles in your body. For example, magnesium helps keep your blood pressure regular and your heart rhythm steady. It helps build your bones and teeth. When you're sick, magnesium helps your body fight infections. And magnesium helps produce energy for your body to use.  Most adults need 300 to 400 milligrams (mg) of magnesium a day. Magnesium is found in foods, especially nuts, whole grains, dark green vegetables, seafood, and cocoa. It's also available as a supplement. Most people can get enough magnesium through a healthy diet that emphasizes unprocessed foods, including whole grain products.  What are some foods that contain magnesium?  Magnesium is found in many foods in varying amounts. Sometimes the ingredients added to foods make a difference. A plain bagel contains 8 milligrams (mg) of magnesium. But a multi-grain bagel has 69 mg.  This list shows the magnesium levels in a variety of foods and their portion sizes:  ?? Almonds, ?? cup = 97 mg  ?? Artichoke, 1 = 50 mg  ?? Avocado, ?? cup = 22 mg  ?? Banana, 1 cup mashed = 61 mg  ?? Baked beans, ?? cup = 33 mg  ?? Beef, ground, 3 oz = 17 mg  ?? Beef, top sirloin, 3 oz = 20 mg  ?? Bread, wheat bran, 1 oz = 23 mg  ?? Chocolate, dark, 1 oz = 41 mg  ?? Hazelnuts, ?? cup = 47 mg  ?? Kale, cooked, 1 cup = 23 mg  ?? Lima beans, large, ?? cup = 41 mg  ?? Potatoes, russet, baked, 1 cup = 90 mg  ?? Salmon, canned, 3 oz = 27 mg  ?? Spinach, raw, 1 cup = 24 mg  ?? Tuna, canned, 3 oz = 29 mg  ?? [place], ground, 3 oz = 21 mg  Who may need extra magnesium?  Some illnesses and medicines may change how much magnesium you can absorb from food. Or they can cause your body to release more magnesium through urine than it should. Examples of these illnesses include Crohn's disease, celiac disease, and type 2 diabetes. People with these conditions may need to increase the amount of magnesium they consume. Your doctor can tell you if you need more magnesium.  Where can you learn more?  Go to [facility] at [email or web address].[employer] forces logistics/support/administrative officer in the Menu. Enter [phone] in the search box to learn more about Learning About Magnesium.  Current as of: June 07, 2020??????????????????????????????Content Version: 13.1  ?? 2006-2021 [employer].   Care instructions adapted under license by [facility]. If you have questions about a medical condition or this instruction, always ask your healthcare professional. [employer] disclaims any warranty or liability for your use of this information.

## 2020-12-08 NOTE — Unmapped (Signed)
Pt assessed during IVF.  Patient hands and feet look good with no signs of HFS.  Patient states that his rectal pain has increased a bit since talking to him earlier this week.  He has continued sitz baths and pain medications with relief.  Patient states that he continues to have difficulty eating and drinking still.  Assessed patient's mouth and he has large white patchy areas on the roof of his mouth and at the back right on his tongue.  Spoke with provider and got approval to send in nystatin to patient's [facility].  Explained how to take the nystatin and patient verbalized understanding.  He will use four times a day as script is written.  Patient states that he has used listerine with alcohol and it hurt like hell.  Reinforced with patient that he should not use use any mouthwash with alcohol.  He is aware that his last day of RT and Xeloda were yesterday.  He will dispose of leftover Xeloda pills.     Patient asked about next steps and informed him that Care Coordinator will be reaching out to set up his next appointment with Dr. Jomarie Longs in April.  No other questions at this time.

## 2020-12-08 NOTE — Unmapped (Signed)
Pt here for IVF. He reports having intermittent diarrhea, controlled with imodium. Also reports poor appetite but states he is finished with xeloda and radiation and that this will improve. Encouraged him to eat small, frequent meals as well as increase fluid intake. Denies pain, nausea, or vomiting. Potassium 3.2, order obtained for 20 meq KCL IV.     1600: Care transferred to Marja Kays, RN.

## 2020-12-19 MED FILL — ALBUTEROL SULFATE HFA 90 MCG/ACTUATION AEROSOL INHALER: RESPIRATORY_TRACT | 25 days supply | Qty: 18 | Fill #1

## 2020-12-22 NOTE — Unmapped (Signed)
Attempted oncology nutrition follow up this day via phone.  No answer, unable to leave voice message.  Per EMR, completion of RT and chemo now at [place].  RD will continue to be available if nutritional needs arise along with [place] [facility] RD.

## 2020-12-25 DIAGNOSIS — C210 Malignant neoplasm of anus, unspecified: Principal | ICD-10-CM

## 2020-12-25 NOTE — Unmapped (Signed)
RADIATION POST-TREATMENT MANAGEMENT NOTE     Encounter Date: 12/25/2020  Patient Name: Dustin Lopez  Medical Record Number: [id number]    DIAGNOSIS:    1. Anal adenocarcinoma (CMS-HCC)        STAGE:  cT4N1    TREATMENT PLAN:  Neoadjuvant chemoradiation.    SUBJECTIVE:  Continues to improve.  Appetite improving.  Energy starting to return.  Diarrhea subsided about 10 days ago.  Denies any recent bleeding.  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    PHYSICAL EXAM:  Vital Signs for this encounter:  BP 118/78  - Pulse 63  - Temp 36.5 ??C (97.7 ??F) (Oral)  - Resp 18  - Wt 68.3 kg (150 lb 8 oz)  - SpO2 100%  - BMI 21.59 kg/m??   General:  Alert and Oriented X 3.  No acute distress.    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  Moist desquamation resolved except 1 small area in superior aspect of gluteal cleft.     ASSESSMENT: 5040 cGy of planned 5040 cGy completed 12/07/2020  Tolerated with RT with expected skin/GI toxicity, now recovering as expected.  Ready to move on to surgical evaluation.    RECOMMENDATIONS:  1. Plan for Follow-up:  RTC 3-4 months.  Inbasket to Dr. Peri Jefferson to arrange for surgical evaluation.  2. Skin/Dermatitis:  Improving.  Continue emollients.  3. Pain: Resolved.  4. Nausea:  Resolved.  5. Diarrhea:  Resolved         As part of weekly management, chart and films reviewed.     Benedetto Goad, MD  [facility] Radiation Oncology at [facility]

## 2020-12-25 NOTE — Unmapped (Addendum)
Pt seen today by Dr Ottis Stain for completion of XRT on 12/07/20 for anal ca. Reviewed meds and allergies with pt. Pt denies pain or distress at this time. Pain in legs comes and goes with R>L. Fatigue improved; pt states I'm getting better. Pt states appetite improved; eating better, it took awhile. Weight today at 150.5 with last weight on 3/8 at 153.25. pt denies any diarrhea; stopped about 10 days ago. Rectum, skin improved; still using Silvadene for skin care. Pt able to sit with improvement. Denies any issues with urination. No RN in for exam; chaperone offered. Dr Ottis Stain will discuss with Dr Jomarie Longs further plan of care. Pt with questions about next step. Pt will follow up with Dr Ottis Stain in 3-4 months; will contact to schedule accordingly.

## 2020-12-25 NOTE — Unmapped (Signed)
RADIATION ONCOLOGY TREATMENT COMPLETION NOTE    Encounter Date: 12/07/2020  Patient Name: Dustin Lopez  Medical Record Number: [id number]    Referring Physician: No referring provider defined for this encounter.    Primary Care Provider: Star Age, PA    Diagnosis:  1. Anal adenocarcinoma (CMS-HCC)      Stage:  cT4N1    Clinical trial: no    Treatment Intent: curative, neoadjuvant    Dustin Lopez has completed a course of radiation therapy for anal adenocarcinoma in setting of fistula.  The details of treatment are summarized below.       Radiation Treatment Summary:        Treatment  site Treatment  Technique/  Modality Dose per fraction Total number  of fractions Total dose Start date End date   Elective Pelvic lymph nodes, gross disease IMRT - Fixed Fields - Mixed 6/18 MV photons 180 cGy 25 4500 cGy 10/23/2020   12/04/2020   Anal canal boost IMRT  - Fixed Fields - 6 MV photons  180 cGy 3 540 cGy 12/05/2020 12/07/2020     Tolerance to Treatment:  Dustin Lopez tolerated radiotherapy reasonably well, developing expected moderate/severe dermatitis including areas of moist desquamation in the gluteal crease.  This did require a treatment break of about 10 days.  No appreciable changes in the fistulas during RT.  He also developed loose stools/diarrhea, controlled with imodium.  Moderate expected fatigue.    PLAN FOR FOLLOW-UP:   1. Follow up with me in 3 weeks for skin check, earlier if needed.   2. Continue follow-up with Dr. Jomarie Longs.    Benedetto Goad, MD  [facility] Radiation Oncology at [facility]

## 2020-12-25 NOTE — Unmapped (Signed)
General Information: Some patients question if radiation remains in the body after treatment.  ??? During treatment the radiation passes through your body but does not remain inside the body.   ??? You are not ???radioactive???. However, radiation works to destroy cancer cells for weeks after treatments.    ??? This is why cancer cells continue to be killed and symptoms from injuries to normal tissue may persist for a few days up to a few weeks.       ??? It may take several weeks for your normal urinary and bowel habits to return.  ??? If you have problems with diarrhea, continue eating a low fiber diet to help reduce the number of soft or loose stools. This will also assist in the healing of damaged tissues.  ??? Avoid these foods if you have excess gas and cramping: beer, broccoli, cabbages, cauliflower, chewing gum, colas, corn, fried foods, raw fruits and vegetables.   ??? If your rectal area is sore, gently clean with warm water after each bowel movement, and then pat dry. You can apply Desitin, Aquaphor, or A&D ointment around the rectal area to help soothe    Cancer Rehabilitation:  ??? Fatigue is a very common experience following any form of cancer treatment.  Regular exercise is a good way to help with your feelings of tiredness. Check with your doctor before starting a new exercise regimen. Gradually increase your exercise program.   ??? Cancer Rehabilitation services are available to all patients undergoing cancer treatments. These services provide cancer patient assessments, education, and rehabilitation designed to combat fatigue and other side effects brought on by cancer treatments. If you have any questions about this program contact [facility] at [phone].

## 2020-12-26 DIAGNOSIS — C210 Malignant neoplasm of anus, unspecified: Principal | ICD-10-CM

## 2020-12-26 NOTE — Unmapped (Signed)
This patient has been disenrolled from the [facility] specialty pharmacy services due to therapy completion - expected therapy completion date: 12/07/20.    Rollen Sox  [facility] Specialty Pharmacist

## 2020-12-27 ENCOUNTER — Ambulatory Visit
Admit: 2020-12-27 | Discharge: 2020-12-28 | Payer: MEDICARE | Attending: Cardiovascular Disease | Primary: Cardiovascular Disease

## 2020-12-27 DIAGNOSIS — I429 Cardiomyopathy, unspecified: Principal | ICD-10-CM

## 2020-12-27 DIAGNOSIS — I712 Thoracic aortic aneurysm, without rupture: Principal | ICD-10-CM

## 2020-12-27 DIAGNOSIS — R0602 Shortness of breath: Principal | ICD-10-CM

## 2020-12-27 DIAGNOSIS — I4821 Permanent atrial fibrillation: Principal | ICD-10-CM

## 2020-12-27 DIAGNOSIS — I2510 Atherosclerotic heart disease of native coronary artery without angina pectoris: Principal | ICD-10-CM

## 2021-01-01 ENCOUNTER — Ambulatory Visit: Admit: 2021-01-01 | Discharge: 2021-01-02 | Payer: MEDICARE

## 2021-01-01 DIAGNOSIS — C210 Malignant neoplasm of anus, unspecified: Principal | ICD-10-CM

## 2021-01-01 MED ORDER — OXYCODONE 5 MG TABLET
ORAL_TABLET | ORAL | 0 refills | 5 days | Status: CP | PRN
Start: 2021-01-01 — End: 2021-01-31

## 2021-01-01 MED ORDER — LORAZEPAM 1 MG TABLET
ORAL_TABLET | Freq: Every evening | ORAL | 0 refills | 30 days | Status: CP | PRN
Start: 2021-01-01 — End: ?

## 2021-01-02 ENCOUNTER — Ambulatory Visit: Admit: 2021-01-02 | Discharge: 2021-01-03 | Payer: MEDICARE

## 2021-01-02 MED ORDER — FUROSEMIDE 20 MG TABLET
ORAL_TABLET | Freq: Every day | ORAL | 1 refills | 90.00000 days | Status: CP | PRN
Start: 2021-01-02 — End: ?

## 2021-01-04 DIAGNOSIS — C210 Malignant neoplasm of anus, unspecified: Principal | ICD-10-CM

## 2021-01-05 ENCOUNTER — Ambulatory Visit: Admit: 2021-01-05 | Discharge: 2021-01-06 | Payer: MEDICARE

## 2021-01-05 DIAGNOSIS — C210 Malignant neoplasm of anus, unspecified: Principal | ICD-10-CM

## 2021-01-07 ENCOUNTER — Ambulatory Visit: Admit: 2021-01-07 | Discharge: 2021-01-08 | Payer: MEDICARE

## 2021-01-09 ENCOUNTER — Ambulatory Visit: Admit: 2021-01-09 | Discharge: 2021-01-10 | Payer: MEDICARE | Attending: Surgery | Primary: Surgery

## 2021-01-09 DIAGNOSIS — K611 Rectal abscess: Principal | ICD-10-CM

## 2021-01-09 DIAGNOSIS — C20 Malignant neoplasm of rectum: Principal | ICD-10-CM

## 2021-01-09 MED ORDER — AMOXICILLIN 875 MG-POTASSIUM CLAVULANATE 125 MG TABLET
ORAL_TABLET | Freq: Two times a day (BID) | ORAL | 0 refills | 14 days | Status: CP
Start: 2021-01-09 — End: 2021-01-23

## 2021-01-18 ENCOUNTER — Ambulatory Visit: Admit: 2021-01-18 | Discharge: 2021-01-19 | Payer: MEDICARE

## 2021-01-19 ENCOUNTER — Ambulatory Visit: Admit: 2021-01-19 | Discharge: 2021-01-20 | Payer: MEDICARE

## 2021-01-19 DIAGNOSIS — C20 Malignant neoplasm of rectum: Principal | ICD-10-CM

## 2021-01-19 MED ORDER — NEOMYCIN 500 MG TABLET
ORAL_TABLET | 0 refills | 0 days | Status: CP
Start: 2021-01-19 — End: ?

## 2021-01-19 MED ORDER — METRONIDAZOLE 500 MG TABLET
ORAL_TABLET | 0 refills | 0 days | Status: CP
Start: 2021-01-19 — End: ?

## 2021-01-19 MED ORDER — ONDANSETRON HCL 4 MG TABLET
ORAL_TABLET | 0 refills | 0 days | Status: CP
Start: 2021-01-19 — End: ?

## 2021-01-19 MED ORDER — SODIUM,POTASSIUM,MAG SULFATES 17.5 GRAM-3.13 GRAM-1.6 GRAM ORAL SOLN
0 refills | 0 days | Status: CP
Start: 2021-01-19 — End: ?

## 2021-01-22 ENCOUNTER — Ambulatory Visit: Admit: 2021-01-22 | Discharge: 2021-01-23 | Payer: MEDICARE

## 2021-01-24 ENCOUNTER — Ambulatory Visit
Admit: 2021-01-24 | Discharge: 2021-01-30 | Disposition: A | Discharge status: Home / Self Care | Payer: MEDICARE | Admitting: Surgery

## 2021-01-24 ENCOUNTER — Encounter
Admit: 2021-01-24 | Discharge: 2021-01-30 | Disposition: A | Discharge status: Home / Self Care | Payer: MEDICARE | Attending: Certified Registered" | Admitting: Surgery

## 2021-01-24 ENCOUNTER — Encounter
Admit: 2021-01-24 | Discharge: 2021-01-30 | Disposition: A | Discharge status: Home / Self Care | Payer: MEDICARE | Attending: Surgery | Admitting: Surgery

## 2021-01-24 ENCOUNTER — Encounter
Admit: 2021-01-24 | Discharge: 2021-01-30 | Disposition: A | Discharge status: Home / Self Care | Payer: MEDICARE | Admitting: Surgery

## 2021-01-26 MED ORDER — METHOCARBAMOL 500 MG TABLET
ORAL_TABLET | Freq: Three times a day (TID) | ORAL | 0 refills | 5 days | PRN
Start: 2021-01-26 — End: ?

## 2021-01-26 MED ORDER — MISCELLANEOUS MEDICAL SUPPLY MISC
99 refills | 0 days
Start: 2021-01-26 — End: ?

## 2021-01-26 MED ORDER — OXYCODONE 5 MG TABLET
ORAL_TABLET | ORAL | 0 refills | 1 days | PRN
Start: 2021-01-26 — End: 2021-01-31

## 2021-01-30 MED ORDER — MISCELLANEOUS MEDICAL SUPPLY MISC
99 refills | 0 days | Status: CP
Start: 2021-01-30 — End: ?

## 2021-01-30 MED ORDER — OXYCODONE 5 MG TABLET
ORAL_TABLET | ORAL | 0 refills | 1 days | Status: CP | PRN
Start: 2021-01-30 — End: 2021-02-04
  Filled 2021-01-30: qty 10, 2d supply, fill #0

## 2021-01-30 MED ORDER — TAMSULOSIN 0.4 MG CAPSULE
ORAL_CAPSULE | Freq: Every evening | ORAL | 3 refills | 90 days | Status: CP
Start: 2021-01-30 — End: 2022-01-30
  Filled 2021-01-30: qty 90, 90d supply, fill #0

## 2021-01-31 DIAGNOSIS — C210 Malignant neoplasm of anus, unspecified: Principal | ICD-10-CM

## 2021-02-05 MED ORDER — OXYCODONE 5 MG TABLET
ORAL_TABLET | ORAL | 0 refills | 2 days | Status: CP | PRN
Start: 2021-02-05 — End: 2021-02-10

## 2021-02-11 ENCOUNTER — Encounter
Admit: 2021-02-11 | Discharge: 2021-02-21 | Disposition: A | Discharge status: Home Health | Payer: MEDICARE | Attending: Certified Registered" | Admitting: Surgery

## 2021-02-11 ENCOUNTER — Encounter
Admit: 2021-02-11 | Discharge: 2021-02-21 | Disposition: A | Discharge status: Home Health | Payer: MEDICARE | Attending: Family | Admitting: Surgery

## 2021-02-11 ENCOUNTER — Ambulatory Visit
Admit: 2021-02-11 | Discharge: 2021-02-21 | Disposition: A | Discharge status: Home Health | Payer: MEDICARE | Admitting: Surgery

## 2021-02-13 ENCOUNTER — Ambulatory Visit: Admit: 2021-02-13 | Payer: MEDICARE | Attending: Surgery | Primary: Surgery

## 2021-02-20 DIAGNOSIS — K611 Rectal abscess: Principal | ICD-10-CM

## 2021-02-21 MED ORDER — HYDROMORPHONE 2 MG TABLET
ORAL_TABLET | ORAL | 0 refills | 4 days | Status: CP | PRN
Start: 2021-02-21 — End: 2021-02-26
  Filled 2021-02-21: qty 20, 4d supply, fill #0

## 2021-02-21 MED ORDER — METRONIDAZOLE 500 MG TABLET
ORAL_TABLET | Freq: Three times a day (TID) | ORAL | 0 refills | 4 days | Status: CP
Start: 2021-02-21 — End: ?
  Filled 2021-02-21: qty 10, 4d supply, fill #0

## 2021-02-21 MED ORDER — CIPROFLOXACIN 500 MG TABLET
ORAL_TABLET | Freq: Two times a day (BID) | ORAL | 0 refills | 5.00000 days | Status: CP
Start: 2021-02-21 — End: ?
  Filled 2021-02-21: qty 10, 5d supply, fill #0

## 2021-02-23 MED ORDER — HYDROMORPHONE 2 MG TABLET
ORAL_TABLET | ORAL | 0 refills | 4 days | Status: CP | PRN
Start: 2021-02-23 — End: 2021-02-28

## 2021-03-05 ENCOUNTER — Ambulatory Visit: Admit: 2021-03-05 | Discharge: 2021-03-05 | Disposition: A | Discharge status: Home / Self Care | Payer: MEDICARE

## 2021-03-05 DIAGNOSIS — S31000A Unspecified open wound of lower back and pelvis without penetration into retroperitoneum, initial encounter: Principal | ICD-10-CM

## 2021-03-05 DIAGNOSIS — L089 Local infection of the skin and subcutaneous tissue, unspecified: Principal | ICD-10-CM

## 2021-03-05 DIAGNOSIS — M533 Sacrococcygeal disorders, not elsewhere classified: Principal | ICD-10-CM

## 2021-03-05 DIAGNOSIS — T148XXA Other injury of unspecified body region, initial encounter: Principal | ICD-10-CM

## 2021-03-05 MED ORDER — METRONIDAZOLE 500 MG TABLET
ORAL_TABLET | Freq: Three times a day (TID) | ORAL | 0 refills | 4 days | Status: CP
Start: 2021-03-05 — End: ?

## 2021-03-05 MED ORDER — CIPROFLOXACIN 500 MG TABLET
ORAL_TABLET | Freq: Two times a day (BID) | ORAL | 0 refills | 5 days | Status: CP
Start: 2021-03-05 — End: ?

## 2021-03-05 MED ORDER — OXYCODONE 5 MG TABLET
ORAL_TABLET | Freq: Four times a day (QID) | ORAL | 0 refills | 13 days | Status: CP | PRN
Start: 2021-03-05 — End: ?

## 2021-03-07 MED ORDER — HYDROMORPHONE 2 MG TABLET
ORAL_TABLET | ORAL | 0 refills | 7 days | Status: CP | PRN
Start: 2021-03-07 — End: 2021-03-12

## 2021-03-14 ENCOUNTER — Ambulatory Visit: Admit: 2021-03-14 | Discharge: 2021-04-12 | Payer: MEDICARE

## 2021-03-14 DIAGNOSIS — K611 Rectal abscess: Principal | ICD-10-CM

## 2021-03-19 MED ORDER — GABAPENTIN 600 MG TABLET
ORAL_TABLET | Freq: Two times a day (BID) | ORAL | 3 refills | 90.00000 days
Start: 2021-03-19 — End: ?

## 2021-03-19 MED ORDER — HYDROMORPHONE 2 MG TABLET
ORAL_TABLET | ORAL | 0 refills | 7.00000 days | Status: CP | PRN
Start: 2021-03-19 — End: 2021-03-24

## 2021-03-19 MED ORDER — OXYCODONE 5 MG TABLET
ORAL_TABLET | Freq: Four times a day (QID) | ORAL | 0 refills | 13 days | Status: CP | PRN
Start: 2021-03-19 — End: ?

## 2021-03-29 DIAGNOSIS — Y842 Radiological procedure and radiotherapy as the cause of abnormal reaction of the patient, or of later complication, without mention of misadventure at the time of the procedure: Principal | ICD-10-CM

## 2021-03-29 DIAGNOSIS — L598 Other specified disorders of the skin and subcutaneous tissue related to radiation: Principal | ICD-10-CM

## 2021-03-29 DIAGNOSIS — C210 Malignant neoplasm of anus, unspecified: Principal | ICD-10-CM

## 2021-03-29 MED ORDER — AMOXICILLIN 875 MG-POTASSIUM CLAVULANATE 125 MG TABLET
ORAL_TABLET | Freq: Two times a day (BID) | ORAL | 1 refills | 7 days | Status: CP
Start: 2021-03-29 — End: 2021-04-05

## 2021-04-04 DIAGNOSIS — C20 Malignant neoplasm of rectum: Principal | ICD-10-CM

## 2021-04-04 MED ORDER — OXYCODONE 5 MG TABLET
ORAL_TABLET | Freq: Four times a day (QID) | ORAL | 0 refills | 13 days | Status: CP | PRN
Start: 2021-04-04 — End: ?

## 2021-04-05 ENCOUNTER — Ambulatory Visit: Admit: 2021-04-05 | Discharge: 2021-04-06 | Payer: MEDICARE | Attending: Surgery | Primary: Surgery

## 2021-04-12 DIAGNOSIS — C210 Malignant neoplasm of anus, unspecified: Principal | ICD-10-CM

## 2021-04-12 DIAGNOSIS — L598 Other specified disorders of the skin and subcutaneous tissue related to radiation: Principal | ICD-10-CM

## 2021-04-12 DIAGNOSIS — Y842 Radiological procedure and radiotherapy as the cause of abnormal reaction of the patient, or of later complication, without mention of misadventure at the time of the procedure: Principal | ICD-10-CM

## 2021-04-12 MED ORDER — AMOXICILLIN 875 MG-POTASSIUM CLAVULANATE 125 MG TABLET
ORAL_TABLET | Freq: Two times a day (BID) | ORAL | 1 refills | 21 days | Status: CP
Start: 2021-04-12 — End: 2021-05-03

## 2021-04-18 MED ORDER — OXYCODONE 5 MG TABLET
ORAL_TABLET | Freq: Four times a day (QID) | ORAL | 0 refills | 13.00000 days | PRN
Start: 2021-04-18 — End: ?

## 2021-04-19 MED ORDER — OXYCODONE 5 MG TABLET
ORAL_TABLET | Freq: Four times a day (QID) | ORAL | 0 refills | 13.00000 days | Status: CP | PRN
Start: 2021-04-19 — End: ?

## 2021-04-30 MED ORDER — OXYCODONE 5 MG TABLET
ORAL_TABLET | Freq: Four times a day (QID) | ORAL | 0 refills | 13.00000 days | Status: CP | PRN
Start: 2021-04-30 — End: ?

## 2021-05-01 ENCOUNTER — Ambulatory Visit: Admit: 2021-05-01 | Discharge: 2021-05-12 | Payer: MEDICARE

## 2021-05-01 DIAGNOSIS — L598 Other specified disorders of the skin and subcutaneous tissue related to radiation: Principal | ICD-10-CM

## 2021-05-01 DIAGNOSIS — C210 Malignant neoplasm of anus, unspecified: Principal | ICD-10-CM

## 2021-05-01 DIAGNOSIS — Y842 Radiological procedure and radiotherapy as the cause of abnormal reaction of the patient, or of later complication, without mention of misadventure at the time of the procedure: Principal | ICD-10-CM

## 2021-05-01 DIAGNOSIS — T8131XA Disruption of external operation (surgical) wound, not elsewhere classified, initial encounter: Principal | ICD-10-CM

## 2021-05-08 ENCOUNTER — Institutional Professional Consult (permissible substitution): Admit: 2021-05-08 | Discharge: 2021-05-09 | Payer: MEDICARE

## 2021-05-08 DIAGNOSIS — C20 Malignant neoplasm of rectum: Principal | ICD-10-CM

## 2021-05-11 ENCOUNTER — Encounter: Admit: 2021-05-11 | Discharge: 2021-05-11 | Payer: MEDICARE | Attending: Registered Nurse | Primary: Registered Nurse

## 2021-05-11 ENCOUNTER — Ambulatory Visit: Admit: 2021-05-11 | Discharge: 2021-05-11 | Payer: MEDICARE

## 2021-05-11 ENCOUNTER — Encounter: Admit: 2021-05-11 | Discharge: 2021-05-11 | Payer: MEDICARE | Attending: Anesthesiology | Primary: Anesthesiology

## 2021-05-11 MED ORDER — OXYCODONE 5 MG TABLET
ORAL_TABLET | Freq: Four times a day (QID) | ORAL | 0 refills | 3 days | Status: CP | PRN
Start: 2021-05-11 — End: ?
  Filled 2021-05-11: qty 10, 3d supply, fill #0

## 2021-05-11 MED ORDER — METHOCARBAMOL 500 MG TABLET
ORAL_TABLET | Freq: Three times a day (TID) | ORAL | 0 refills | 5.00000 days | Status: CP | PRN
Start: 2021-05-11 — End: ?
  Filled 2021-05-11: qty 30, 5d supply, fill #0

## 2021-05-15 ENCOUNTER — Ambulatory Visit: Admit: 2021-05-15 | Payer: MEDICARE

## 2021-05-15 DIAGNOSIS — T8131XA Disruption of external operation (surgical) wound, not elsewhere classified, initial encounter: Principal | ICD-10-CM

## 2021-05-15 DIAGNOSIS — L598 Other specified disorders of the skin and subcutaneous tissue related to radiation: Principal | ICD-10-CM

## 2021-05-15 DIAGNOSIS — C210 Malignant neoplasm of anus, unspecified: Principal | ICD-10-CM

## 2021-05-15 DIAGNOSIS — Y842 Radiological procedure and radiotherapy as the cause of abnormal reaction of the patient, or of later complication, without mention of misadventure at the time of the procedure: Principal | ICD-10-CM

## 2021-05-16 MED ORDER — OXYCODONE 5 MG TABLET
ORAL_TABLET | Freq: Three times a day (TID) | ORAL | 0 refills | 7.00000 days | Status: CP | PRN
Start: 2021-05-16 — End: ?

## 2021-05-24 ENCOUNTER — Ambulatory Visit: Admit: 2021-05-24 | Discharge: 2021-05-25 | Payer: MEDICARE | Attending: Surgery | Primary: Surgery

## 2021-05-24 MED ORDER — OXYCODONE 5 MG TABLET
ORAL_TABLET | Freq: Three times a day (TID) | ORAL | 0 refills | 10 days | Status: CP | PRN
Start: 2021-05-24 — End: ?

## 2021-05-29 DIAGNOSIS — L598 Other specified disorders of the skin and subcutaneous tissue related to radiation: Principal | ICD-10-CM

## 2021-05-29 DIAGNOSIS — Y842 Radiological procedure and radiotherapy as the cause of abnormal reaction of the patient, or of later complication, without mention of misadventure at the time of the procedure: Principal | ICD-10-CM

## 2021-05-29 DIAGNOSIS — C210 Malignant neoplasm of anus, unspecified: Principal | ICD-10-CM

## 2021-05-29 DIAGNOSIS — T8131XA Disruption of external operation (surgical) wound, not elsewhere classified, initial encounter: Principal | ICD-10-CM

## 2021-05-31 ENCOUNTER — Ambulatory Visit: Admit: 2021-05-31 | Payer: MEDICARE

## 2021-06-12 ENCOUNTER — Ambulatory Visit: Admit: 2021-06-12 | Payer: MEDICARE

## 2021-06-12 DIAGNOSIS — L598 Other specified disorders of the skin and subcutaneous tissue related to radiation: Principal | ICD-10-CM

## 2021-06-12 DIAGNOSIS — C210 Malignant neoplasm of anus, unspecified: Principal | ICD-10-CM

## 2021-06-12 DIAGNOSIS — Y842 Radiological procedure and radiotherapy as the cause of abnormal reaction of the patient, or of later complication, without mention of misadventure at the time of the procedure: Principal | ICD-10-CM

## 2021-06-12 DIAGNOSIS — T8131XA Disruption of external operation (surgical) wound, not elsewhere classified, initial encounter: Principal | ICD-10-CM

## 2021-06-14 ENCOUNTER — Ambulatory Visit: Admit: 2021-06-14 | Discharge: 2021-06-15 | Payer: MEDICARE

## 2021-06-14 DIAGNOSIS — G4700 Insomnia, unspecified: Principal | ICD-10-CM

## 2021-06-14 DIAGNOSIS — C210 Malignant neoplasm of anus, unspecified: Principal | ICD-10-CM

## 2021-06-14 MED ORDER — TRAZODONE 100 MG TABLET
ORAL_TABLET | Freq: Every day | ORAL | 3 refills | 90 days
Start: 2021-06-14 — End: ?

## 2021-06-14 MED ORDER — LORAZEPAM 1 MG TABLET
ORAL_TABLET | Freq: Every evening | ORAL | 0 refills | 30 days | Status: CN | PRN
Start: 2021-06-14 — End: ?

## 2021-06-15 ENCOUNTER — Ambulatory Visit: Admit: 2021-06-15 | Discharge: 2021-06-16 | Payer: MEDICARE

## 2021-06-15 MED ORDER — TRAZODONE 100 MG TABLET
ORAL_TABLET | Freq: Every day | ORAL | 3 refills | 90.00000 days | Status: CP
Start: 2021-06-15 — End: ?
  Filled 2021-06-15: qty 90, 90d supply, fill #0

## 2021-06-19 DIAGNOSIS — Z08 Encounter for follow-up examination after completed treatment for malignant neoplasm: Principal | ICD-10-CM

## 2021-06-19 DIAGNOSIS — Z85038 Personal history of other malignant neoplasm of large intestine: Principal | ICD-10-CM

## 2021-06-20 ENCOUNTER — Ambulatory Visit
Admit: 2021-06-20 | Discharge: 2021-06-20 | Payer: MEDICARE | Attending: Cardiovascular Disease | Primary: Cardiovascular Disease

## 2021-06-20 ENCOUNTER — Ambulatory Visit: Admit: 2021-06-20 | Discharge: 2021-06-20 | Payer: MEDICARE

## 2021-06-20 DIAGNOSIS — Z23 Encounter for immunization: Principal | ICD-10-CM

## 2021-06-20 DIAGNOSIS — J449 Chronic obstructive pulmonary disease, unspecified: Principal | ICD-10-CM

## 2021-06-20 DIAGNOSIS — E782 Mixed hyperlipidemia: Principal | ICD-10-CM

## 2021-06-20 DIAGNOSIS — I429 Cardiomyopathy, unspecified: Principal | ICD-10-CM

## 2021-06-20 DIAGNOSIS — C210 Malignant neoplasm of anus, unspecified: Principal | ICD-10-CM

## 2021-06-20 DIAGNOSIS — F17200 Nicotine dependence, unspecified, uncomplicated: Principal | ICD-10-CM

## 2021-06-20 DIAGNOSIS — I10 Essential (primary) hypertension: Principal | ICD-10-CM

## 2021-06-20 DIAGNOSIS — G894 Chronic pain syndrome: Principal | ICD-10-CM

## 2021-06-20 DIAGNOSIS — G629 Polyneuropathy, unspecified: Principal | ICD-10-CM

## 2021-06-20 DIAGNOSIS — N182 Chronic kidney disease, stage 2 (mild): Principal | ICD-10-CM

## 2021-06-20 DIAGNOSIS — I4891 Unspecified atrial fibrillation: Principal | ICD-10-CM

## 2021-06-20 MED ORDER — METOPROLOL TARTRATE 50 MG TABLET
ORAL_TABLET | Freq: Two times a day (BID) | ORAL | 3 refills | 90 days | Status: CP
Start: 2021-06-20 — End: 2021-07-20

## 2021-06-20 MED ORDER — ATORVASTATIN 40 MG TABLET
ORAL_TABLET | Freq: Every evening | ORAL | 3 refills | 90 days | Status: CP
Start: 2021-06-20 — End: ?

## 2021-06-20 MED ORDER — ALBUTEROL SULFATE HFA 90 MCG/ACTUATION AEROSOL INHALER
Freq: Four times a day (QID) | RESPIRATORY_TRACT | 5 refills | 25 days | Status: CP | PRN
Start: 2021-06-20 — End: 2022-06-20

## 2021-06-20 MED ORDER — FLUTICASONE 250 MCG-SALMETEROL 50 MCG/DOSE BLISTR POWDR FOR INHALATION
Freq: Two times a day (BID) | RESPIRATORY_TRACT | 3 refills | 90 days | Status: CP
Start: 2021-06-20 — End: ?

## 2021-06-20 MED ORDER — PREGABALIN 50 MG CAPSULE
ORAL_CAPSULE | Freq: Three times a day (TID) | ORAL | 2 refills | 30.00000 days | Status: CP
Start: 2021-06-20 — End: 2022-06-20

## 2021-06-20 MED ORDER — LISINOPRIL 20 MG TABLET
ORAL_TABLET | Freq: Every day | ORAL | 3 refills | 90 days | Status: CP
Start: 2021-06-20 — End: 2021-07-20

## 2021-06-22 ENCOUNTER — Ambulatory Visit: Admit: 2021-06-22 | Discharge: 2021-06-23 | Payer: MEDICARE

## 2021-06-30 MED ORDER — FUROSEMIDE 20 MG TABLET
ORAL_TABLET | 1 refills | 0 days
Start: 2021-06-30 — End: ?

## 2021-07-02 MED ORDER — FUROSEMIDE 20 MG TABLET
ORAL_TABLET | 1 refills | 0 days | Status: CP
Start: 2021-07-02 — End: ?

## 2021-07-03 ENCOUNTER — Ambulatory Visit: Admit: 2021-07-03 | Discharge: 2021-07-04 | Payer: MEDICARE

## 2021-07-03 DIAGNOSIS — T8131XA Disruption of external operation (surgical) wound, not elsewhere classified, initial encounter: Principal | ICD-10-CM

## 2021-07-03 DIAGNOSIS — C210 Malignant neoplasm of anus, unspecified: Principal | ICD-10-CM

## 2021-07-03 DIAGNOSIS — Y842 Radiological procedure and radiotherapy as the cause of abnormal reaction of the patient, or of later complication, without mention of misadventure at the time of the procedure: Principal | ICD-10-CM

## 2021-07-03 DIAGNOSIS — L598 Other specified disorders of the skin and subcutaneous tissue related to radiation: Principal | ICD-10-CM

## 2021-07-05 DIAGNOSIS — C20 Malignant neoplasm of rectum: Principal | ICD-10-CM

## 2021-07-05 MED ORDER — ONDANSETRON HCL 4 MG TABLET
ORAL_TABLET | 0 refills | 0 days | Status: CP
Start: 2021-07-05 — End: ?

## 2021-07-05 MED ORDER — SODIUM,POTASSIUM,MAG SULFATES 17.5 GRAM-3.13 GRAM-1.6 GRAM ORAL SOLN
0 refills | 0 days | Status: CP
Start: 2021-07-05 — End: ?

## 2021-07-06 ENCOUNTER — Encounter: Admit: 2021-07-06 | Discharge: 2021-07-06 | Payer: MEDICARE

## 2021-07-06 ENCOUNTER — Ambulatory Visit: Admit: 2021-07-06 | Discharge: 2021-07-06 | Payer: MEDICARE

## 2021-09-04 MED ORDER — APIXABAN 5 MG TABLET
ORAL_TABLET | Freq: Two times a day (BID) | ORAL | 3 refills | 90.00000 days
Start: 2021-09-04 — End: ?

## 2021-09-05 MED ORDER — APIXABAN 5 MG TABLET
ORAL_TABLET | Freq: Two times a day (BID) | ORAL | 3 refills | 90.00000 days | Status: CP
Start: 2021-09-05 — End: ?
  Filled 2021-09-06: qty 180, 90d supply, fill #0

## 2021-09-12 DIAGNOSIS — C210 Malignant neoplasm of anus, unspecified: Principal | ICD-10-CM

## 2021-09-19 ENCOUNTER — Ambulatory Visit: Admit: 2021-09-19 | Discharge: 2021-09-19 | Payer: MEDICARE

## 2021-09-19 DIAGNOSIS — G629 Polyneuropathy, unspecified: Principal | ICD-10-CM

## 2021-09-19 DIAGNOSIS — C210 Malignant neoplasm of anus, unspecified: Principal | ICD-10-CM

## 2021-09-19 DIAGNOSIS — G894 Chronic pain syndrome: Principal | ICD-10-CM

## 2021-09-19 DIAGNOSIS — I429 Cardiomyopathy, unspecified: Principal | ICD-10-CM

## 2021-09-19 DIAGNOSIS — I4891 Unspecified atrial fibrillation: Principal | ICD-10-CM

## 2021-09-19 DIAGNOSIS — N182 Chronic kidney disease, stage 2 (mild): Principal | ICD-10-CM

## 2021-09-19 DIAGNOSIS — I10 Essential (primary) hypertension: Principal | ICD-10-CM

## 2021-09-19 DIAGNOSIS — F17200 Nicotine dependence, unspecified, uncomplicated: Principal | ICD-10-CM

## 2021-09-19 DIAGNOSIS — I639 Cerebral infarction, unspecified: Principal | ICD-10-CM

## 2021-09-19 DIAGNOSIS — I712 Thoracic aortic aneurysm without rupture, unspecified part: Principal | ICD-10-CM

## 2021-09-19 DIAGNOSIS — G40219 Localization-related (focal) (partial) symptomatic epilepsy and epileptic syndromes with complex partial seizures, intractable, without status epilepticus: Principal | ICD-10-CM

## 2021-09-19 DIAGNOSIS — G4700 Insomnia, unspecified: Principal | ICD-10-CM

## 2021-09-19 MED ORDER — OXYCODONE 5 MG TABLET
ORAL_TABLET | Freq: Three times a day (TID) | ORAL | 0 refills | 10.00000 days | Status: CP | PRN
Start: 2021-09-19 — End: ?

## 2021-09-19 MED ORDER — PREGABALIN 75 MG CAPSULE
ORAL_CAPSULE | Freq: Two times a day (BID) | ORAL | 2 refills | 30 days | Status: CP
Start: 2021-09-19 — End: 2022-09-19

## 2021-09-21 ENCOUNTER — Ambulatory Visit: Admit: 2021-09-21 | Discharge: 2021-09-22 | Payer: MEDICARE

## 2021-10-23 ENCOUNTER — Ambulatory Visit: Admit: 2021-10-23 | Discharge: 2021-10-23 | Payer: MEDICARE

## 2021-10-24 ENCOUNTER — Ambulatory Visit
Admit: 2021-10-24 | Discharge: 2021-10-25 | Payer: MEDICARE | Attending: Cardiovascular Disease | Primary: Cardiovascular Disease

## 2021-10-24 DIAGNOSIS — I10 Essential (primary) hypertension: Principal | ICD-10-CM

## 2021-10-24 DIAGNOSIS — I480 Paroxysmal atrial fibrillation: Principal | ICD-10-CM

## 2021-10-24 DIAGNOSIS — I7121 Aneurysm of ascending aorta without rupture: Principal | ICD-10-CM

## 2021-10-24 DIAGNOSIS — C210 Malignant neoplasm of anus, unspecified: Principal | ICD-10-CM

## 2021-10-24 DIAGNOSIS — I429 Cardiomyopathy, unspecified: Principal | ICD-10-CM

## 2021-10-24 MED ORDER — METOPROLOL TARTRATE 50 MG TABLET
ORAL_TABLET | Freq: Two times a day (BID) | ORAL | 3 refills | 90 days | Status: CP
Start: 2021-10-24 — End: 2021-11-23

## 2021-12-19 ENCOUNTER — Ambulatory Visit: Admit: 2021-12-19 | Discharge: 2021-12-19 | Payer: MEDICARE

## 2021-12-19 DIAGNOSIS — G894 Chronic pain syndrome: Principal | ICD-10-CM

## 2021-12-19 DIAGNOSIS — C210 Malignant neoplasm of anus, unspecified: Principal | ICD-10-CM

## 2021-12-19 DIAGNOSIS — G6289 Other specified polyneuropathies: Principal | ICD-10-CM

## 2021-12-19 MED ORDER — OXYCODONE 5 MG TABLET
ORAL_TABLET | Freq: Three times a day (TID) | ORAL | 0 refills | 10 days | Status: CP | PRN
Start: 2021-12-19 — End: ?

## 2021-12-26 ENCOUNTER — Ambulatory Visit: Admit: 2021-12-26 | Discharge: 2021-12-27 | Payer: MEDICARE

## 2022-01-01 ENCOUNTER — Ambulatory Visit: Admit: 2022-01-01 | Discharge: 2022-01-02 | Payer: MEDICARE

## 2022-01-04 ENCOUNTER — Ambulatory Visit: Admit: 2022-01-04 | Discharge: 2022-01-05 | Payer: MEDICARE

## 2022-01-04 DIAGNOSIS — M79672 Pain in left foot: Principal | ICD-10-CM

## 2022-01-04 DIAGNOSIS — R449 Unspecified symptoms and signs involving general sensations and perceptions: Principal | ICD-10-CM

## 2022-01-04 DIAGNOSIS — G40219 Localization-related (focal) (partial) symptomatic epilepsy and epileptic syndromes with complex partial seizures, intractable, without status epilepticus: Principal | ICD-10-CM

## 2022-01-04 DIAGNOSIS — I639 Cerebral infarction, unspecified: Principal | ICD-10-CM

## 2022-01-04 DIAGNOSIS — G4733 Obstructive sleep apnea (adult) (pediatric): Principal | ICD-10-CM

## 2022-01-17 DIAGNOSIS — C210 Malignant neoplasm of anus, unspecified: Principal | ICD-10-CM

## 2022-01-21 ENCOUNTER — Ambulatory Visit: Admit: 2022-01-21 | Discharge: 2022-01-21 | Payer: MEDICARE

## 2022-01-21 DIAGNOSIS — G894 Chronic pain syndrome: Principal | ICD-10-CM

## 2022-01-21 DIAGNOSIS — C210 Malignant neoplasm of anus, unspecified: Principal | ICD-10-CM

## 2022-01-21 MED ORDER — OXYCODONE 5 MG TABLET
ORAL_TABLET | Freq: Three times a day (TID) | ORAL | 0 refills | 10 days | Status: CP | PRN
Start: 2022-01-21 — End: ?

## 2022-01-30 ENCOUNTER — Ambulatory Visit: Admit: 2022-01-30 | Payer: MEDICARE

## 2022-02-10 DIAGNOSIS — G40219 Localization-related (focal) (partial) symptomatic epilepsy and epileptic syndromes with complex partial seizures, intractable, without status epilepticus: Principal | ICD-10-CM

## 2022-02-10 MED ORDER — CARBAMAZEPINE ER 200 MG CAPSULE,EXTENDED RELEASE 12HR
ORAL_CAPSULE | 1 refills | 0 days
Start: 2022-02-10 — End: ?

## 2022-02-12 MED ORDER — CARBAMAZEPINE ER 200 MG CAPSULE,EXTENDED RELEASE 12HR
ORAL_CAPSULE | 1 refills | 0 days | Status: CP
Start: 2022-02-12 — End: ?
  Filled 2022-02-12: qty 360, 90d supply, fill #0

## 2022-03-20 ENCOUNTER — Ambulatory Visit: Admit: 2022-03-20 | Discharge: 2022-03-20 | Payer: MEDICARE | Attending: Clinical | Primary: Clinical

## 2022-03-20 ENCOUNTER — Ambulatory Visit: Admit: 2022-03-20 | Discharge: 2022-03-20 | Payer: MEDICARE

## 2022-03-20 ENCOUNTER — Ambulatory Visit: Admit: 2022-03-20 | Discharge: 2022-03-21 | Payer: MEDICARE

## 2022-03-20 DIAGNOSIS — G40219 Localization-related (focal) (partial) symptomatic epilepsy and epileptic syndromes with complex partial seizures, intractable, without status epilepticus: Principal | ICD-10-CM

## 2022-03-20 DIAGNOSIS — I4891 Unspecified atrial fibrillation: Principal | ICD-10-CM

## 2022-03-20 DIAGNOSIS — E782 Mixed hyperlipidemia: Principal | ICD-10-CM

## 2022-03-20 DIAGNOSIS — N1831 Stage 3a chronic kidney disease (CMS-HCC): Principal | ICD-10-CM

## 2022-03-20 DIAGNOSIS — I429 Cardiomyopathy, unspecified: Principal | ICD-10-CM

## 2022-03-20 DIAGNOSIS — F17200 Nicotine dependence, unspecified, uncomplicated: Principal | ICD-10-CM

## 2022-03-20 DIAGNOSIS — G629 Polyneuropathy, unspecified: Principal | ICD-10-CM

## 2022-03-20 DIAGNOSIS — I10 Essential (primary) hypertension: Principal | ICD-10-CM

## 2022-03-20 DIAGNOSIS — I639 Cerebral infarction, unspecified: Principal | ICD-10-CM

## 2022-03-20 DIAGNOSIS — C210 Malignant neoplasm of anus, unspecified: Principal | ICD-10-CM

## 2022-03-20 DIAGNOSIS — G894 Chronic pain syndrome: Principal | ICD-10-CM

## 2022-03-20 MED ORDER — OXYCODONE 5 MG TABLET
ORAL_TABLET | Freq: Three times a day (TID) | ORAL | 0 refills | 10 days | Status: CP | PRN
Start: 2022-03-20 — End: ?

## 2022-03-20 MED ORDER — PREGABALIN 75 MG CAPSULE
ORAL_CAPSULE | Freq: Two times a day (BID) | ORAL | 2 refills | 30 days | Status: CP
Start: 2022-03-20 — End: 2023-03-20

## 2022-03-22 DIAGNOSIS — E875 Hyperkalemia: Principal | ICD-10-CM

## 2022-04-19 MED FILL — ELIQUIS 5 MG TABLET: ORAL | 90 days supply | Qty: 180 | Fill #1

## 2022-04-24 ENCOUNTER — Ambulatory Visit: Admit: 2022-04-24 | Payer: MEDICARE | Attending: Cardiovascular Disease | Primary: Cardiovascular Disease

## 2022-04-24 DIAGNOSIS — C210 Malignant neoplasm of anus, unspecified: Principal | ICD-10-CM

## 2022-04-25 ENCOUNTER — Ambulatory Visit: Admit: 2022-04-25 | Payer: MEDICARE

## 2022-04-30 DIAGNOSIS — J449 Chronic obstructive pulmonary disease, unspecified: Principal | ICD-10-CM

## 2022-04-30 MED ORDER — ALBUTEROL SULFATE HFA 90 MCG/ACTUATION AEROSOL INHALER
Freq: Four times a day (QID) | RESPIRATORY_TRACT | 5 refills | 25 days | PRN
Start: 2022-04-30 — End: 2023-04-30

## 2022-04-30 MED ORDER — LISINOPRIL 20 MG TABLET
ORAL_TABLET | Freq: Every day | ORAL | 3 refills | 90 days
Start: 2022-04-30 — End: 2022-05-30

## 2022-05-01 MED ORDER — LISINOPRIL 20 MG TABLET
ORAL_TABLET | Freq: Every day | ORAL | 3 refills | 90 days | Status: CP
Start: 2022-05-01 — End: 2023-04-26

## 2022-05-01 MED ORDER — ALBUTEROL SULFATE HFA 90 MCG/ACTUATION AEROSOL INHALER
Freq: Four times a day (QID) | RESPIRATORY_TRACT | 5 refills | 25 days | Status: CP | PRN
Start: 2022-05-01 — End: 2023-05-01

## 2022-05-01 MED FILL — TRAZODONE 100 MG TABLET: ORAL | 90 days supply | Qty: 90 | Fill #1

## 2022-05-02 ENCOUNTER — Ambulatory Visit: Admit: 2022-05-02 | Discharge: 2022-05-03 | Payer: MEDICARE

## 2022-05-02 DIAGNOSIS — C210 Malignant neoplasm of anus, unspecified: Principal | ICD-10-CM

## 2022-05-02 DIAGNOSIS — Z720 Tobacco use: Principal | ICD-10-CM

## 2022-05-02 DIAGNOSIS — G894 Chronic pain syndrome: Principal | ICD-10-CM

## 2022-05-02 MED ORDER — OXYCODONE 5 MG TABLET
ORAL_TABLET | Freq: Three times a day (TID) | ORAL | 0 refills | 10 days | Status: CP | PRN
Start: 2022-05-02 — End: ?

## 2022-05-29 ENCOUNTER — Ambulatory Visit
Admit: 2022-05-29 | Discharge: 2022-05-30 | Payer: MEDICARE | Attending: Cardiovascular Disease | Primary: Cardiovascular Disease

## 2022-05-29 DIAGNOSIS — I4821 Permanent atrial fibrillation: Principal | ICD-10-CM

## 2022-05-29 DIAGNOSIS — I712 Thoracic aortic aneurysm without rupture, unspecified part (CMS-HCC): Principal | ICD-10-CM

## 2022-05-29 DIAGNOSIS — I10 Essential (primary) hypertension: Principal | ICD-10-CM

## 2022-05-29 MED ORDER — METOPROLOL TARTRATE 50 MG TABLET
ORAL_TABLET | Freq: Two times a day (BID) | ORAL | 3 refills | 90 days | Status: CP
Start: 2022-05-29 — End: 2022-06-28

## 2022-05-29 MED ORDER — AMLODIPINE 5 MG TABLET
ORAL_TABLET | Freq: Every morning | ORAL | 3 refills | 90 days | Status: CP
Start: 2022-05-29 — End: ?

## 2022-05-29 MED ORDER — LISINOPRIL 40 MG TABLET
ORAL_TABLET | Freq: Every day | ORAL | 2 refills | 90 days | Status: CP
Start: 2022-05-29 — End: 2023-05-29

## 2022-06-21 MED ORDER — ATORVASTATIN 40 MG TABLET
ORAL_TABLET | Freq: Every evening | ORAL | 1 refills | 90 days | Status: CP
Start: 2022-06-21 — End: ?

## 2022-07-24 MED FILL — ELIQUIS 5 MG TABLET: ORAL | 90 days supply | Qty: 180 | Fill #2

## 2022-07-25 ENCOUNTER — Ambulatory Visit: Admit: 2022-07-25 | Discharge: 2022-07-26 | Payer: MEDICARE

## 2022-07-25 DIAGNOSIS — R449 Unspecified symptoms and signs involving general sensations and perceptions: Principal | ICD-10-CM

## 2022-07-25 DIAGNOSIS — I639 Cerebral infarction, unspecified: Principal | ICD-10-CM

## 2022-07-25 DIAGNOSIS — M79672 Pain in left foot: Principal | ICD-10-CM

## 2022-07-25 DIAGNOSIS — G4733 Obstructive sleep apnea (adult) (pediatric): Principal | ICD-10-CM

## 2022-07-25 DIAGNOSIS — G40219 Localization-related (focal) (partial) symptomatic epilepsy and epileptic syndromes with complex partial seizures, intractable, without status epilepticus: Principal | ICD-10-CM

## 2022-07-25 DIAGNOSIS — I10 Essential (primary) hypertension: Principal | ICD-10-CM

## 2022-07-25 MED ORDER — CARBAMAZEPINE ER 200 MG CAPSULE,EXTENDED RELEASE 12HR
ORAL_CAPSULE | 1 refills | 0 days | Status: CP
Start: 2022-07-25 — End: ?
  Filled 2022-07-24: qty 360, 90d supply, fill #1

## 2022-08-01 DIAGNOSIS — C210 Malignant neoplasm of anus, unspecified: Principal | ICD-10-CM

## 2022-08-02 ENCOUNTER — Ambulatory Visit: Admit: 2022-08-02 | Discharge: 2022-08-02 | Payer: MEDICARE

## 2022-08-02 DIAGNOSIS — Z720 Tobacco use: Principal | ICD-10-CM

## 2022-08-02 DIAGNOSIS — C210 Malignant neoplasm of anus, unspecified: Principal | ICD-10-CM

## 2022-08-02 DIAGNOSIS — D751 Secondary polycythemia: Principal | ICD-10-CM

## 2022-09-19 ENCOUNTER — Ambulatory Visit: Admit: 2022-09-19 | Payer: MEDICARE

## 2022-11-01 DIAGNOSIS — C210 Malignant neoplasm of anus, unspecified: Principal | ICD-10-CM

## 2022-11-04 ENCOUNTER — Ambulatory Visit: Admit: 2022-11-04 | Payer: MEDICARE

## 2022-11-21 DIAGNOSIS — J449 Chronic obstructive pulmonary disease, unspecified: Principal | ICD-10-CM

## 2022-11-21 MED ORDER — ALBUTEROL SULFATE HFA 90 MCG/ACTUATION AEROSOL INHALER
2 refills | 0 days | Status: CP
Start: 2022-11-21 — End: ?

## 2022-11-27 ENCOUNTER — Ambulatory Visit
Admit: 2022-11-27 | Discharge: 2022-11-28 | Payer: MEDICARE | Attending: Cardiovascular Disease | Primary: Cardiovascular Disease

## 2022-11-27 DIAGNOSIS — Z720 Tobacco use: Principal | ICD-10-CM

## 2022-11-27 DIAGNOSIS — I4821 Permanent atrial fibrillation: Principal | ICD-10-CM

## 2022-11-27 DIAGNOSIS — I10 Essential (primary) hypertension: Principal | ICD-10-CM

## 2022-11-27 DIAGNOSIS — I25119 Atherosclerotic heart disease of native coronary artery with unspecified angina pectoris: Principal | ICD-10-CM

## 2022-11-27 DIAGNOSIS — I712 Thoracic aortic aneurysm without rupture, unspecified part (CMS-HCC): Principal | ICD-10-CM

## 2022-11-27 MED ORDER — NITROGLYCERIN 0.4 MG SUBLINGUAL TABLET
ORAL_TABLET | SUBLINGUAL | 5 refills | 1 days | Status: CP | PRN
Start: 2022-11-27 — End: ?

## 2022-12-19 ENCOUNTER — Ambulatory Visit: Admit: 2022-12-19 | Discharge: 2022-12-19 | Payer: MEDICARE

## 2022-12-19 DIAGNOSIS — I25119 Atherosclerotic heart disease of native coronary artery with unspecified angina pectoris: Principal | ICD-10-CM

## 2022-12-19 DIAGNOSIS — I4821 Permanent atrial fibrillation: Principal | ICD-10-CM

## 2022-12-19 DIAGNOSIS — I10 Essential (primary) hypertension: Principal | ICD-10-CM

## 2022-12-19 DIAGNOSIS — G4700 Insomnia, unspecified: Principal | ICD-10-CM

## 2022-12-19 DIAGNOSIS — G894 Chronic pain syndrome: Principal | ICD-10-CM

## 2022-12-19 DIAGNOSIS — E782 Mixed hyperlipidemia: Principal | ICD-10-CM

## 2022-12-19 DIAGNOSIS — N1831 Stage 3a chronic kidney disease (CMS-HCC): Principal | ICD-10-CM

## 2022-12-19 DIAGNOSIS — I429 Cardiomyopathy, unspecified: Principal | ICD-10-CM

## 2022-12-19 DIAGNOSIS — F17200 Nicotine dependence, unspecified, uncomplicated: Principal | ICD-10-CM

## 2022-12-19 DIAGNOSIS — Z23 Encounter for immunization: Principal | ICD-10-CM

## 2022-12-19 DIAGNOSIS — J449 Chronic obstructive pulmonary disease, unspecified: Principal | ICD-10-CM

## 2022-12-19 DIAGNOSIS — G629 Polyneuropathy, unspecified: Principal | ICD-10-CM

## 2022-12-19 DIAGNOSIS — C210 Malignant neoplasm of anus, unspecified: Principal | ICD-10-CM

## 2022-12-19 DIAGNOSIS — K50113 Crohn's disease of large intestine with fistula: Principal | ICD-10-CM

## 2022-12-19 MED ORDER — OXYCODONE 5 MG TABLET
ORAL_TABLET | Freq: Three times a day (TID) | ORAL | 0 refills | 10 days | Status: CP | PRN
Start: 2022-12-19 — End: ?

## 2023-01-07 MED ORDER — METOPROLOL TARTRATE 100 MG TABLET
ORAL_TABLET | Freq: Two times a day (BID) | ORAL | 0 refills | 90 days
Start: 2023-01-07 — End: ?

## 2023-01-10 DIAGNOSIS — I480 Paroxysmal atrial fibrillation: Principal | ICD-10-CM

## 2023-01-10 DIAGNOSIS — I712 Thoracic aortic aneurysm without rupture, unspecified part (CMS-HCC): Principal | ICD-10-CM

## 2023-01-10 DIAGNOSIS — I4821 Permanent atrial fibrillation: Principal | ICD-10-CM

## 2023-01-10 DIAGNOSIS — I429 Cardiomyopathy, unspecified: Principal | ICD-10-CM

## 2023-01-10 DIAGNOSIS — I25119 Atherosclerotic heart disease of native coronary artery with unspecified angina pectoris: Principal | ICD-10-CM

## 2023-01-10 DIAGNOSIS — I2510 Atherosclerotic heart disease of native coronary artery without angina pectoris: Principal | ICD-10-CM

## 2023-01-10 DIAGNOSIS — I10 Essential (primary) hypertension: Principal | ICD-10-CM

## 2023-01-14 MED ORDER — METOPROLOL TARTRATE 50 MG TABLET
ORAL_TABLET | Freq: Two times a day (BID) | ORAL | 1 refills | 90.00000 days | Status: CP
Start: 2023-01-14 — End: 2023-01-14

## 2023-01-17 MED ORDER — METOPROLOL TARTRATE 100 MG TABLET
ORAL_TABLET | Freq: Two times a day (BID) | ORAL | 0 refills | 90 days
Start: 2023-01-17 — End: ?

## 2023-01-27 ENCOUNTER — Ambulatory Visit: Admit: 2023-01-27 | Discharge: 2023-01-27 | Payer: MEDICARE

## 2023-02-12 MED ORDER — LISINOPRIL 40 MG TABLET
ORAL_TABLET | Freq: Every day | ORAL | 0 refills | 30 days | Status: CP
Start: 2023-02-12 — End: ?

## 2023-02-14 ENCOUNTER — Ambulatory Visit
Admit: 2023-02-14 | Discharge: 2023-02-15 | Payer: MEDICARE | Attending: Nurse Practitioner | Primary: Nurse Practitioner

## 2023-02-26 ENCOUNTER — Ambulatory Visit
Admit: 2023-02-26 | Discharge: 2023-02-27 | Payer: MEDICARE | Attending: Cardiovascular Disease | Primary: Cardiovascular Disease

## 2023-02-26 MED ORDER — AMLODIPINE 10 MG TABLET
ORAL_TABLET | Freq: Every day | ORAL | 3 refills | 90 days | Status: CP
Start: 2023-02-26 — End: 2024-02-26

## 2023-02-26 MED ORDER — DOXAZOSIN 2 MG TABLET
ORAL_TABLET | Freq: Every evening | ORAL | 3 refills | 90 days | Status: CP
Start: 2023-02-26 — End: 2024-02-26

## 2023-02-26 MED ORDER — AMLODIPINE 5 MG TABLET
ORAL_TABLET | Freq: Every morning | ORAL | 3 refills | 90 days | Status: CP
Start: 2023-02-26 — End: 2023-02-26

## 2023-03-06 ENCOUNTER — Ambulatory Visit: Admit: 2023-03-06 | Discharge: 2023-03-07 | Payer: MEDICARE | Attending: Surgery | Primary: Surgery

## 2023-03-06 DIAGNOSIS — G894 Chronic pain syndrome: Principal | ICD-10-CM

## 2023-03-06 DIAGNOSIS — C210 Malignant neoplasm of anus, unspecified: Principal | ICD-10-CM

## 2023-03-06 MED ORDER — OXYCODONE 5 MG TABLET
ORAL_TABLET | Freq: Three times a day (TID) | ORAL | 0 refills | 10 days | Status: CP | PRN
Start: 2023-03-06 — End: ?

## 2023-03-06 MED ORDER — MELOXICAM 15 MG TABLET
ORAL_TABLET | Freq: Every day | ORAL | 0 refills | 30.00000 days | Status: CP
Start: 2023-03-06 — End: 2023-03-06

## 2023-03-10 MED ORDER — LISINOPRIL 40 MG TABLET
ORAL_TABLET | Freq: Every day | ORAL | 1 refills | 90 days | Status: CP
Start: 2023-03-10 — End: ?

## 2023-04-04 MED ORDER — MELOXICAM 15 MG TABLET
ORAL_TABLET | Freq: Every day | ORAL | 0 refills | 30 days | Status: CP
Start: 2023-04-04 — End: 2024-04-03

## 2023-04-07 DIAGNOSIS — G4700 Insomnia, unspecified: Principal | ICD-10-CM

## 2023-04-07 MED ORDER — TRAZODONE 100 MG TABLET
ORAL_TABLET | Freq: Every day | ORAL | 0 refills | 90.00000 days | Status: CP
Start: 2023-04-07 — End: ?

## 2023-05-06 DIAGNOSIS — G894 Chronic pain syndrome: Principal | ICD-10-CM

## 2023-05-06 DIAGNOSIS — C210 Malignant neoplasm of anus, unspecified: Principal | ICD-10-CM

## 2023-05-07 ENCOUNTER — Ambulatory Visit: Admit: 2023-05-07 | Discharge: 2023-05-08 | Payer: MEDICARE

## 2023-05-07 DIAGNOSIS — C210 Malignant neoplasm of anus, unspecified: Principal | ICD-10-CM

## 2023-05-07 DIAGNOSIS — G894 Chronic pain syndrome: Principal | ICD-10-CM

## 2023-05-07 MED ORDER — OXYCODONE 5 MG TABLET
ORAL_TABLET | Freq: Three times a day (TID) | ORAL | 0 refills | 10 days | Status: CP | PRN
Start: 2023-05-07 — End: ?

## 2023-05-13 MED ORDER — ATORVASTATIN 40 MG TABLET
ORAL_TABLET | Freq: Every evening | ORAL | 1 refills | 90 days
Start: 2023-05-13 — End: ?

## 2023-05-13 MED ORDER — APIXABAN 5 MG TABLET
ORAL_TABLET | Freq: Two times a day (BID) | ORAL | 3 refills | 90 days
Start: 2023-05-13 — End: ?

## 2023-05-15 MED ORDER — APIXABAN 5 MG TABLET
ORAL_TABLET | Freq: Two times a day (BID) | ORAL | 3 refills | 90 days | Status: CP
Start: 2023-05-15 — End: ?

## 2023-05-15 MED ORDER — ATORVASTATIN 40 MG TABLET
ORAL | 2 refills | 90 days | Status: CP
Start: 2023-05-15 — End: ?

## 2023-06-03 ENCOUNTER — Ambulatory Visit: Admit: 2023-06-03 | Payer: MEDICARE

## 2023-06-09 MED ORDER — LISINOPRIL 40 MG TABLET
ORAL_TABLET | Freq: Every day | ORAL | 1 refills | 90 days | Status: CP
Start: 2023-06-09 — End: ?

## 2023-06-12 ENCOUNTER — Ambulatory Visit: Admit: 2023-06-12 | Payer: MEDICARE | Attending: Surgery | Primary: Surgery

## 2023-06-25 ENCOUNTER — Ambulatory Visit: Admit: 2023-06-25 | Discharge: 2023-06-26 | Payer: MEDICARE

## 2023-06-25 DIAGNOSIS — J432 Centrilobular emphysema: Principal | ICD-10-CM

## 2023-06-25 DIAGNOSIS — C210 Malignant neoplasm of anus, unspecified: Principal | ICD-10-CM

## 2023-06-25 DIAGNOSIS — E782 Mixed hyperlipidemia: Principal | ICD-10-CM

## 2023-06-25 DIAGNOSIS — Z23 Encounter for immunization: Principal | ICD-10-CM

## 2023-06-25 DIAGNOSIS — K6289 Other specified diseases of anus and rectum: Principal | ICD-10-CM

## 2023-06-25 DIAGNOSIS — G8929 Other chronic pain: Principal | ICD-10-CM

## 2023-06-25 DIAGNOSIS — F17200 Nicotine dependence, unspecified, uncomplicated: Principal | ICD-10-CM

## 2023-06-25 DIAGNOSIS — J449 Chronic obstructive pulmonary disease, unspecified: Principal | ICD-10-CM

## 2023-06-25 DIAGNOSIS — G894 Chronic pain syndrome: Principal | ICD-10-CM

## 2023-06-25 DIAGNOSIS — I10 Essential (primary) hypertension: Principal | ICD-10-CM

## 2023-06-25 DIAGNOSIS — G4700 Insomnia, unspecified: Principal | ICD-10-CM

## 2023-06-25 DIAGNOSIS — G40219 Localization-related (focal) (partial) symptomatic epilepsy and epileptic syndromes with complex partial seizures, intractable, without status epilepticus: Principal | ICD-10-CM

## 2023-06-25 DIAGNOSIS — I25119 Atherosclerotic heart disease of native coronary artery with unspecified angina pectoris: Principal | ICD-10-CM

## 2023-06-25 MED ORDER — FLUTICASONE 250 MCG-SALMETEROL 50 MCG/DOSE BLISTR POWDR FOR INHALATION
Freq: Two times a day (BID) | RESPIRATORY_TRACT | 3 refills | 90 days | Status: CP
Start: 2023-06-25 — End: ?

## 2023-06-25 MED ORDER — TRAZODONE 100 MG TABLET
ORAL_TABLET | Freq: Every day | ORAL | 1 refills | 90 days | Status: CP
Start: 2023-06-25 — End: ?

## 2023-06-25 MED ORDER — VARENICLINE 0.5 MG (11)-1 MG (42) TABLETS IN A DOSE PACK
0 refills | 0 days | Status: CP
Start: 2023-06-25 — End: 2023-09-23

## 2023-06-25 MED ORDER — ALBUTEROL SULFATE HFA 90 MCG/ACTUATION AEROSOL INHALER
Freq: Four times a day (QID) | RESPIRATORY_TRACT | 2 refills | 25 days | Status: CP | PRN
Start: 2023-06-25 — End: ?

## 2023-06-25 MED ORDER — PREGABALIN 75 MG CAPSULE
ORAL_CAPSULE | Freq: Two times a day (BID) | ORAL | 2 refills | 30 days | Status: CP
Start: 2023-06-25 — End: 2024-06-24

## 2023-07-03 ENCOUNTER — Ambulatory Visit: Admit: 2023-07-03 | Discharge: 2023-07-04 | Payer: MEDICARE | Attending: Surgery | Primary: Surgery

## 2023-07-03 MED ORDER — MELOXICAM 15 MG TABLET
ORAL_TABLET | Freq: Every day | ORAL | 3 refills | 30 days | Status: CP
Start: 2023-07-03 — End: 2024-07-02

## 2023-09-05 DIAGNOSIS — F17200 Nicotine dependence, unspecified, uncomplicated: Principal | ICD-10-CM

## 2023-09-05 MED ORDER — VARENICLINE 0.5 MG (11)-1 MG (42) TABLETS IN A DOSE PACK
0 refills | 0 days | Status: CP
Start: 2023-09-05 — End: ?

## 2023-11-05 DIAGNOSIS — C210 Malignant neoplasm of anus, unspecified: Principal | ICD-10-CM

## 2023-11-05 DIAGNOSIS — G894 Chronic pain syndrome: Principal | ICD-10-CM

## 2023-11-07 ENCOUNTER — Ambulatory Visit: Admit: 2023-11-07 | Payer: MEDICARE

## 2023-11-12 DIAGNOSIS — F17200 Nicotine dependence, unspecified, uncomplicated: Principal | ICD-10-CM

## 2023-11-12 MED ORDER — VARENICLINE TARTRATE 0.5 MG (11)-1 MG (42) TABLETS IN A DOSE PACK
0 refills | 0.00 days
Start: 2023-11-12 — End: ?

## 2023-11-13 MED ORDER — VARENICLINE TARTRATE 0.5 MG (11)-1 MG (42) TABLETS IN A DOSE PACK
0 refills | 0.00 days | Status: CP
Start: 2023-11-13 — End: ?

## 2023-11-15 MED ORDER — METOPROLOL TARTRATE 50 MG TABLET
ORAL_TABLET | Freq: Two times a day (BID) | ORAL | 1 refills | 0.00 days
Start: 2023-11-15 — End: ?

## 2023-11-17 MED ORDER — METOPROLOL TARTRATE 50 MG TABLET
ORAL_TABLET | Freq: Two times a day (BID) | ORAL | 1 refills | 90.00 days | Status: CP
Start: 2023-11-17 — End: ?

## 2023-12-09 DIAGNOSIS — C210 Malignant neoplasm of anus, unspecified: Principal | ICD-10-CM

## 2023-12-10 ENCOUNTER — Ambulatory Visit: Admit: 2023-12-10 | Discharge: 2023-12-11 | Payer: MEDICARE

## 2023-12-12 DIAGNOSIS — C210 Malignant neoplasm of anus, unspecified: Principal | ICD-10-CM

## 2023-12-16 ENCOUNTER — Ambulatory Visit: Admit: 2023-12-16 | Discharge: 2024-01-14 | Payer: Medicare (Managed Care)

## 2023-12-16 ENCOUNTER — Ambulatory Visit: Admit: 2023-12-16 | Payer: MEDICARE

## 2023-12-16 ENCOUNTER — Ambulatory Visit: Admit: 2023-12-16

## 2023-12-16 DIAGNOSIS — C210 Malignant neoplasm of anus, unspecified: Principal | ICD-10-CM

## 2023-12-16 DIAGNOSIS — L598 Other specified disorders of the skin and subcutaneous tissue related to radiation: Principal | ICD-10-CM

## 2023-12-16 DIAGNOSIS — F17200 Nicotine dependence, unspecified, uncomplicated: Principal | ICD-10-CM

## 2023-12-16 DIAGNOSIS — Y842 Radiological procedure and radiotherapy as the cause of abnormal reaction of the patient, or of later complication, without mention of misadventure at the time of the procedure: Principal | ICD-10-CM

## 2023-12-16 DIAGNOSIS — L89154 Pressure ulcer of sacral region, stage 4: Principal | ICD-10-CM

## 2023-12-23 ENCOUNTER — Ambulatory Visit: Admit: 2023-12-23 | Discharge: 2023-12-23 | Payer: MEDICARE

## 2023-12-23 DIAGNOSIS — I25119 Atherosclerotic heart disease of native coronary artery with unspecified angina pectoris: Principal | ICD-10-CM

## 2023-12-23 DIAGNOSIS — E782 Mixed hyperlipidemia: Principal | ICD-10-CM

## 2023-12-23 DIAGNOSIS — I10 Essential (primary) hypertension: Principal | ICD-10-CM

## 2023-12-23 DIAGNOSIS — I4821 Permanent atrial fibrillation: Principal | ICD-10-CM

## 2023-12-23 DIAGNOSIS — J449 Chronic obstructive pulmonary disease, unspecified: Principal | ICD-10-CM

## 2023-12-23 DIAGNOSIS — K50113 Crohn's disease of large intestine with fistula: Principal | ICD-10-CM

## 2023-12-23 DIAGNOSIS — C210 Malignant neoplasm of anus, unspecified: Principal | ICD-10-CM

## 2023-12-23 DIAGNOSIS — G894 Chronic pain syndrome: Principal | ICD-10-CM

## 2023-12-23 DIAGNOSIS — I429 Cardiomyopathy, unspecified: Principal | ICD-10-CM

## 2023-12-23 DIAGNOSIS — G40219 Localization-related (focal) (partial) symptomatic epilepsy and epileptic syndromes with complex partial seizures, intractable, without status epilepticus: Principal | ICD-10-CM

## 2023-12-23 MED ORDER — CARBAMAZEPINE ER 200 MG CAPSULE,EXTENDED RELEASE 12HR
ORAL_CAPSULE | 1 refills | 0 days | Status: CP
Start: 2023-12-23 — End: ?

## 2023-12-23 MED ORDER — FLUTICASONE 250 MCG-SALMETEROL 50 MCG/DOSE BLISTR POWDR FOR INHALATION
Freq: Two times a day (BID) | RESPIRATORY_TRACT | 3 refills | 90 days | Status: CP
Start: 2023-12-23 — End: ?

## 2023-12-23 MED ORDER — ALBUTEROL SULFATE HFA 90 MCG/ACTUATION AEROSOL INHALER
Freq: Four times a day (QID) | RESPIRATORY_TRACT | 2 refills | 25 days | Status: CP | PRN
Start: 2023-12-23 — End: ?

## 2023-12-26 DIAGNOSIS — C210 Malignant neoplasm of anus, unspecified: Principal | ICD-10-CM

## 2023-12-26 MED ORDER — SODIUM,POTASSIUM,MAG SULFATES 17.5 GRAM-3.13 GRAM-1.6 GRAM ORAL SOLN
0 refills | 0 days | Status: CP
Start: 2023-12-26 — End: ?

## 2023-12-26 MED ORDER — ENOXAPARIN 80 MG/0.8 ML SUBCUTANEOUS SYRINGE
SUBCUTANEOUS | 0 refills | 0 days | Status: CP
Start: 2023-12-26 — End: ?

## 2023-12-30 DIAGNOSIS — L89154 Pressure ulcer of sacral region, stage 4: Principal | ICD-10-CM

## 2023-12-30 DIAGNOSIS — L598 Other specified disorders of the skin and subcutaneous tissue related to radiation: Principal | ICD-10-CM

## 2023-12-30 DIAGNOSIS — Y842 Radiological procedure and radiotherapy as the cause of abnormal reaction of the patient, or of later complication, without mention of misadventure at the time of the procedure: Principal | ICD-10-CM

## 2024-01-04 DIAGNOSIS — J449 Chronic obstructive pulmonary disease, unspecified: Principal | ICD-10-CM

## 2024-01-04 MED ORDER — ALBUTEROL SULFATE HFA 90 MCG/ACTUATION AEROSOL INHALER
Freq: Four times a day (QID) | RESPIRATORY_TRACT | 2 refills | 25.00 days | PRN
Start: 2024-01-04 — End: ?

## 2024-01-05 MED ORDER — ALBUTEROL SULFATE HFA 90 MCG/ACTUATION AEROSOL INHALER
Freq: Four times a day (QID) | RESPIRATORY_TRACT | 2 refills | 25.00 days | Status: CP | PRN
Start: 2024-01-05 — End: ?

## 2024-02-07 MED ORDER — ATORVASTATIN 40 MG TABLET
ORAL_TABLET | 2 refills | 0.00000 days
Start: 2024-02-07 — End: ?

## 2024-02-09 MED ORDER — ATORVASTATIN 40 MG TABLET
ORAL_TABLET | ORAL | 3 refills | 0.00000 days | Status: CP
Start: 2024-02-09 — End: ?

## 2024-04-28 DIAGNOSIS — G894 Chronic pain syndrome: Principal | ICD-10-CM

## 2024-04-28 MED ORDER — DOXAZOSIN 2 MG TABLET
ORAL_TABLET | Freq: Every evening | ORAL | 0 refills | 90.00000 days | Status: CP
Start: 2024-04-28 — End: ?

## 2024-04-28 MED ORDER — PREGABALIN 75 MG CAPSULE
ORAL_CAPSULE | Freq: Two times a day (BID) | ORAL | 2 refills | 30.00000 days | Status: CP
Start: 2024-04-28 — End: ?

## 2024-04-29 DIAGNOSIS — J449 Chronic obstructive pulmonary disease, unspecified: Principal | ICD-10-CM

## 2024-04-29 MED ORDER — ALBUTEROL SULFATE HFA 90 MCG/ACTUATION AEROSOL INHALER
Freq: Four times a day (QID) | RESPIRATORY_TRACT | 2 refills | 25.00000 days | Status: CP | PRN
Start: 2024-04-29 — End: ?

## 2024-04-29 MED ORDER — APIXABAN 5 MG TABLET
ORAL_TABLET | Freq: Two times a day (BID) | ORAL | 3 refills | 90.00000 days | Status: CP
Start: 2024-04-29 — End: ?

## 2024-05-05 DIAGNOSIS — G4700 Insomnia, unspecified: Principal | ICD-10-CM

## 2024-05-05 MED ORDER — TRAZODONE 100 MG TABLET
ORAL_TABLET | Freq: Every day | ORAL | 1 refills | 90.00000 days | Status: CP
Start: 2024-05-05 — End: ?

## 2024-05-25 ENCOUNTER — Inpatient Hospital Stay: Admit: 2024-05-25 | Discharge: 2024-05-25 | Payer: Medicare (Managed Care)

## 2024-06-07 DIAGNOSIS — C210 Malignant neoplasm of anus, unspecified: Principal | ICD-10-CM

## 2024-06-10 DIAGNOSIS — C210 Malignant neoplasm of anus, unspecified: Principal | ICD-10-CM

## 2024-06-24 ENCOUNTER — Ambulatory Visit: Admit: 2024-06-24 | Discharge: 2024-06-25 | Payer: Medicare (Managed Care)

## 2024-06-24 DIAGNOSIS — I10 Essential (primary) hypertension: Principal | ICD-10-CM

## 2024-06-24 DIAGNOSIS — C210 Malignant neoplasm of anus, unspecified: Principal | ICD-10-CM

## 2024-06-24 DIAGNOSIS — G894 Chronic pain syndrome: Principal | ICD-10-CM

## 2024-06-24 DIAGNOSIS — J432 Centrilobular emphysema: Principal | ICD-10-CM

## 2024-06-24 DIAGNOSIS — I429 Cardiomyopathy, unspecified: Principal | ICD-10-CM

## 2024-06-24 DIAGNOSIS — E782 Mixed hyperlipidemia: Principal | ICD-10-CM

## 2024-06-24 DIAGNOSIS — I4821 Permanent atrial fibrillation: Principal | ICD-10-CM

## 2024-06-24 DIAGNOSIS — Z23 Encounter for immunization: Principal | ICD-10-CM

## 2024-06-24 DIAGNOSIS — K50113 Crohn's disease of large intestine with fistula: Principal | ICD-10-CM

## 2024-06-24 DIAGNOSIS — I25119 Atherosclerotic heart disease of native coronary artery with unspecified angina pectoris: Principal | ICD-10-CM

## 2024-06-24 DIAGNOSIS — F17200 Nicotine dependence, unspecified, uncomplicated: Principal | ICD-10-CM

## 2024-06-24 DIAGNOSIS — G4700 Insomnia, unspecified: Principal | ICD-10-CM

## 2024-06-24 MED ORDER — AMLODIPINE 10 MG TABLET
ORAL_TABLET | Freq: Every day | ORAL | 3 refills | 90.00000 days | Status: CP
Start: 2024-06-24 — End: 2025-06-24

## 2024-06-24 MED ORDER — ALBUTEROL SULFATE HFA 90 MCG/ACTUATION AEROSOL INHALER
Freq: Four times a day (QID) | RESPIRATORY_TRACT | 2 refills | 25.00000 days | Status: CP | PRN
Start: 2024-06-24 — End: ?

## 2024-06-24 MED ORDER — FLUTICASONE 250 MCG-SALMETEROL 50 MCG/DOSE BLISTR POWDR FOR INHALATION
Freq: Two times a day (BID) | RESPIRATORY_TRACT | 3 refills | 90.00000 days | Status: CP
Start: 2024-06-24 — End: ?

## 2024-06-26 DIAGNOSIS — G40219 Localization-related (focal) (partial) symptomatic epilepsy and epileptic syndromes with complex partial seizures, intractable, without status epilepticus: Principal | ICD-10-CM

## 2024-06-26 MED ORDER — LISINOPRIL 40 MG TABLET
ORAL_TABLET | Freq: Every day | ORAL | 1 refills | 0.00000 days
Start: 2024-06-26 — End: ?

## 2024-06-26 MED ORDER — CARBAMAZEPINE ER 200 MG CAPSULE,EXTENDED RELEASE 12HR
ORAL_CAPSULE | Freq: Two times a day (BID) | ORAL | 1 refills | 0.00000 days
Start: 2024-06-26 — End: ?

## 2024-06-28 MED ORDER — LISINOPRIL 40 MG TABLET
ORAL_TABLET | Freq: Every day | ORAL | 0 refills | 90.00000 days | Status: CP
Start: 2024-06-28 — End: ?

## 2024-06-28 MED ORDER — CARBAMAZEPINE ER 200 MG CAPSULE,EXTENDED RELEASE 12HR
ORAL_CAPSULE | Freq: Two times a day (BID) | ORAL | 1 refills | 90.00000 days | Status: CP
Start: 2024-06-28 — End: ?

## 2024-06-30 ENCOUNTER — Ambulatory Visit: Admit: 2024-06-30 | Discharge: 2024-07-01 | Payer: Medicare (Managed Care)

## 2024-06-30 DIAGNOSIS — C210 Malignant neoplasm of anus, unspecified: Principal | ICD-10-CM

## 2024-07-01 DIAGNOSIS — I10 Essential (primary) hypertension: Principal | ICD-10-CM

## 2024-07-01 DIAGNOSIS — I6529 Occlusion and stenosis of unspecified carotid artery: Principal | ICD-10-CM

## 2024-07-01 DIAGNOSIS — I712 Thoracic aortic aneurysm without rupture, unspecified part: Principal | ICD-10-CM

## 2024-07-01 DIAGNOSIS — I429 Cardiomyopathy, unspecified: Principal | ICD-10-CM

## 2024-07-01 DIAGNOSIS — I4821 Permanent atrial fibrillation: Principal | ICD-10-CM

## 2024-07-01 DIAGNOSIS — I25119 Atherosclerotic heart disease of native coronary artery with unspecified angina pectoris: Principal | ICD-10-CM

## 2024-07-01 DIAGNOSIS — J449 Chronic obstructive pulmonary disease, unspecified: Principal | ICD-10-CM

## 2024-07-01 MED ORDER — HYDRALAZINE 25 MG TABLET
ORAL_TABLET | Freq: Two times a day (BID) | ORAL | 3 refills | 45.00000 days | Status: CP | PRN
Start: 2024-07-01 — End: 2025-07-01

## 2024-07-20 DIAGNOSIS — C210 Malignant neoplasm of anus, unspecified: Principal | ICD-10-CM

## 2024-08-02 DIAGNOSIS — C210 Malignant neoplasm of anus, unspecified: Principal | ICD-10-CM

## 2024-08-11 DIAGNOSIS — C210 Malignant neoplasm of anus, unspecified: Principal | ICD-10-CM

## 2024-08-20 ENCOUNTER — Inpatient Hospital Stay: Admit: 2024-08-20 | Discharge: 2024-08-20 | Payer: Medicare (Managed Care)

## 2024-08-20 DIAGNOSIS — C210 Malignant neoplasm of anus, unspecified: Principal | ICD-10-CM

## 2024-08-24 DIAGNOSIS — C210 Malignant neoplasm of anus, unspecified: Principal | ICD-10-CM

## 2024-08-31 DIAGNOSIS — C210 Malignant neoplasm of anus, unspecified: Principal | ICD-10-CM

## 2024-09-02 DIAGNOSIS — G894 Chronic pain syndrome: Principal | ICD-10-CM

## 2024-09-02 DIAGNOSIS — G4700 Insomnia, unspecified: Principal | ICD-10-CM

## 2024-09-02 MED ORDER — APIXABAN 5 MG TABLET
ORAL_TABLET | Freq: Two times a day (BID) | ORAL | 3 refills | 90.00000 days
Start: 2024-09-02 — End: ?

## 2024-09-02 MED ORDER — LISINOPRIL 40 MG TABLET
ORAL_TABLET | Freq: Every day | ORAL | 1 refills | 90.00000 days | Status: CP
Start: 2024-09-02 — End: ?

## 2024-09-02 MED ORDER — TRAZODONE 100 MG TABLET
ORAL_TABLET | Freq: Every day | ORAL | 1 refills | 90.00000 days
Start: 2024-09-02 — End: ?

## 2024-09-02 MED ORDER — DOXAZOSIN 2 MG TABLET
ORAL_TABLET | Freq: Every evening | ORAL | 1 refills | 90.00000 days | Status: CP
Start: 2024-09-02 — End: ?

## 2024-09-02 MED ORDER — PREGABALIN 75 MG CAPSULE
ORAL_CAPSULE | Freq: Two times a day (BID) | ORAL | 2 refills | 30.00000 days
Start: 2024-09-02 — End: ?

## 2024-09-02 MED ORDER — ATORVASTATIN 40 MG TABLET
ORAL_TABLET | Freq: Every evening | ORAL | 3 refills | 90.00000 days
Start: 2024-09-02 — End: ?

## 2024-09-03 DIAGNOSIS — C210 Malignant neoplasm of anus, unspecified: Principal | ICD-10-CM

## 2024-09-03 MED ORDER — ATORVASTATIN 40 MG TABLET
ORAL_TABLET | Freq: Every evening | ORAL | 3 refills | 90.00000 days | Status: CP
Start: 2024-09-03 — End: ?

## 2024-09-03 MED ORDER — LISINOPRIL 40 MG TABLET
ORAL_TABLET | Freq: Every day | ORAL | 1 refills | 90.00000 days | Status: CP
Start: 2024-09-03 — End: ?

## 2024-09-03 MED ORDER — APIXABAN 5 MG TABLET
ORAL_TABLET | Freq: Two times a day (BID) | ORAL | 3 refills | 90.00000 days | Status: CP
Start: 2024-09-03 — End: ?

## 2024-09-03 MED ORDER — PREGABALIN 75 MG CAPSULE
ORAL_CAPSULE | Freq: Two times a day (BID) | ORAL | 2 refills | 30.00000 days | Status: CP
Start: 2024-09-03 — End: ?

## 2024-09-03 MED ORDER — DOXAZOSIN 2 MG TABLET
ORAL_TABLET | Freq: Every evening | ORAL | 1 refills | 90.00000 days | Status: CP
Start: 2024-09-03 — End: ?

## 2024-09-16 ENCOUNTER — Inpatient Hospital Stay: Admit: 2024-09-16 | Discharge: 2024-09-16 | Payer: Medicare (Managed Care)

## 2024-09-16 DIAGNOSIS — C210 Malignant neoplasm of anus, unspecified: Principal | ICD-10-CM

## 2024-10-06 MED ORDER — METOPROLOL TARTRATE 50 MG TABLET
ORAL_TABLET | Freq: Two times a day (BID) | ORAL | 1 refills | 90.00000 days | Status: CP
Start: 2024-10-06 — End: ?
